# Patient Record
Sex: Male | Born: 1935 | Race: White | Hispanic: No | Marital: Married | Smoking: Former smoker
Health system: Southern US, Community
[De-identification: ages and names within clinical notes are randomized; demographics above are authoritative.]

## PROBLEM LIST (undated history)

## (undated) ENCOUNTER — Emergency Department (HOSPITAL_COMMUNITY): Discharge status: Absent without leave | Payer: Medicare HMO

## (undated) DIAGNOSIS — C4491 Basal cell carcinoma of skin, unspecified: Secondary | ICD-10-CM

## (undated) DIAGNOSIS — E785 Hyperlipidemia, unspecified: Secondary | ICD-10-CM

## (undated) DIAGNOSIS — K8020 Calculus of gallbladder without cholecystitis without obstruction: Secondary | ICD-10-CM

## (undated) DIAGNOSIS — R0602 Shortness of breath: Secondary | ICD-10-CM

## (undated) DIAGNOSIS — D500 Iron deficiency anemia secondary to blood loss (chronic): Secondary | ICD-10-CM

## (undated) DIAGNOSIS — C641 Malignant neoplasm of right kidney, except renal pelvis: Secondary | ICD-10-CM

## (undated) DIAGNOSIS — H9319 Tinnitus, unspecified ear: Secondary | ICD-10-CM

## (undated) DIAGNOSIS — I719 Aortic aneurysm of unspecified site, without rupture: Secondary | ICD-10-CM

## (undated) DIAGNOSIS — I10 Essential (primary) hypertension: Secondary | ICD-10-CM

## (undated) DIAGNOSIS — R001 Bradycardia, unspecified: Secondary | ICD-10-CM

## (undated) DIAGNOSIS — R3912 Poor urinary stream: Secondary | ICD-10-CM

## (undated) DIAGNOSIS — K219 Gastro-esophageal reflux disease without esophagitis: Secondary | ICD-10-CM

## (undated) DIAGNOSIS — M1990 Unspecified osteoarthritis, unspecified site: Secondary | ICD-10-CM

## (undated) HISTORY — PX: VASECTOMY: SHX75

## (undated) HISTORY — DX: Essential (primary) hypertension: I10

## (undated) HISTORY — PX: TONSILLECTOMY: SUR1361

## (undated) HISTORY — DX: Tinnitus, unspecified ear: H93.19

## (undated) HISTORY — DX: Basal cell carcinoma of skin, unspecified: C44.91

## (undated) HISTORY — DX: Hyperlipidemia, unspecified: E78.5

## (undated) HISTORY — DX: Unspecified osteoarthritis, unspecified site: M19.90

## (undated) HISTORY — DX: Iron deficiency anemia secondary to blood loss (chronic): D50.0

## (undated) HISTORY — DX: Malignant neoplasm of right kidney, except renal pelvis: C64.1

## (undated) HISTORY — PX: COLONOSCOPY: SHX174

---

## 1973-09-17 HISTORY — PX: ANKLE SURGERY: SHX546

## 2005-09-17 HISTORY — PX: TOTAL KNEE ARTHROPLASTY: SHX125

## 2012-10-13 DIAGNOSIS — Z135 Encounter for screening for eye and ear disorders: Secondary | ICD-10-CM | POA: Insufficient documentation

## 2012-10-13 DIAGNOSIS — M129 Arthropathy, unspecified: Secondary | ICD-10-CM | POA: Insufficient documentation

## 2012-10-13 DIAGNOSIS — C4490 Unspecified malignant neoplasm of skin, unspecified: Secondary | ICD-10-CM | POA: Insufficient documentation

## 2012-10-13 DIAGNOSIS — H9319 Tinnitus, unspecified ear: Secondary | ICD-10-CM | POA: Insufficient documentation

## 2013-03-10 ENCOUNTER — Ambulatory Visit (INDEPENDENT_AMBULATORY_CARE_PROVIDER_SITE_OTHER): Payer: Medicare Other | Admitting: Surgery

## 2013-03-10 ENCOUNTER — Encounter (INDEPENDENT_AMBULATORY_CARE_PROVIDER_SITE_OTHER): Payer: Self-pay | Admitting: Surgery

## 2013-03-10 VITALS — BP 120/58 | HR 64 | Temp 99.0°F | Resp 16 | Ht 72.0 in | Wt 182.0 lb

## 2013-03-10 DIAGNOSIS — K8010 Calculus of gallbladder with chronic cholecystitis without obstruction: Secondary | ICD-10-CM

## 2013-03-10 DIAGNOSIS — D126 Benign neoplasm of colon, unspecified: Secondary | ICD-10-CM | POA: Insufficient documentation

## 2013-03-10 DIAGNOSIS — K4020 Bilateral inguinal hernia, without obstruction or gangrene, not specified as recurrent: Secondary | ICD-10-CM | POA: Insufficient documentation

## 2013-03-10 NOTE — Progress Notes (Signed)
Patient ID: Bill Foley, male   DOB: [birth year], 79 y.o.   MRN: [id number]  Chief Complaint  Patient presents with  . New Evaluation    eval abd pain/gb/appendiceal adenoma/ ing henia    HPI Bill Foley is a 79 y.o. male.  Referred by Dr. Dulce Sellar for evaluation of adenoma at the appendiceal orifice and symptomatic gallstones.    PCP - Dr. Everlene Other HPI This is an 80 year old male in good health who presents in referral from Dr. Dulce Sellar. The patient had a recent physical examination and was found to have heme positive stool. He was asymptomatic with no significant abdominal pain, gross blood in stool, or change in bowel habits. He has had previous polyps before. He underwent a colonoscopy on 12/19/12. This showed multiple polyps including one at his transverse colon and one in the sigmoid colon. These were removed completely and showed tubular adenoma with no sign of high-grade dysplasia. He also was found to have a sessile polyp immediately adjacent to the appendiceal orifice measuring about 1 cm across. This tracks up into the appendix. Biopsy showed tubular adenoma negative for high grade dysplasia.  The patient reports that about 7 years ago he was living in [place]. He had a severe attack of right upper quadrant abdominal pain associated with nausea and vomiting. He underwent workup with an ultrasound and a HIDA scan.  Reportedly, he was told he had gallstones and he did have his gallbladder removed. However he wanted to have his knee replaced instead. Over the last 7 years he has had several episodes of postprandial right upper quadrant abdominal pain associated with nausea and vomiting. The pain resolves fairly quickly. He has had 2 episodes already this year. This tends to occur after eating greasy meals.  The patient also reports an enlarging bulge in his left groin that has been present for about 6 years. Previous to his knee surgery, he was a fairly active weight lifter. This has continued  to enlarge and has become uncomfortable. It remains reducible. He denies any right-sided symptoms.  The patient is remarkably healthy for his age and remains very active.   Past Medical History  Diagnosis Date  . Blood in stool   . Basal cell carcinoma   . Hyperlipidemia   . Tinnitus   . Hypertension   . Glaucoma   . Arthritis     Past Surgical History  Procedure Laterality Date  . Hernia repair    . Tonsillectomy    . Vasectomy    . Total knee arthroplasty    . Ankle surgery Left 1975  . Colon surgery  12/16/2012  Colonoscopy with polypectomy  History reviewed. No pertinent family history.  Social History History  Substance Use Topics  . Smoking status: Former Smoker    Quit date: 03/11/1963  . Smokeless tobacco: Not on file  . Alcohol Use: 0.0 oz/week    5-7 Glasses of wine per week    No Known Allergies  Current Outpatient Prescriptions  Medication Sig Dispense Refill  . aspirin 81 MG tablet Take 81 mg by mouth daily.      . chlorthalidone (HYGROTON) 25 MG tablet Take 25 mg by mouth daily.      . hydrALAZINE (APRESOLINE) 100 MG tablet Take 100 mg by mouth 2 (two) times daily.      Marland Kitchen ibuprofen (ADVIL,MOTRIN) 200 MG tablet Take 200 mg by mouth every 6 (six) hours as needed for pain.      Bill Foley  300 MG CAPS Take by mouth.      . losartan (COZAAR) 100 MG tablet Take 100 mg by mouth daily.      . pravastatin (PRAVACHOL) 40 MG tablet Take 40 mg by mouth daily.       No current facility-administered medications for this visit.    Review of Systems Review of Systems  Constitutional: Negative for fever, chills and unexpected weight change.  HENT: Negative for hearing loss, congestion, sore throat, trouble swallowing and voice change.   Eyes: Negative for visual disturbance.  Respiratory: Negative for cough and wheezing.   Cardiovascular: Negative for chest pain, palpitations and leg swelling.  Gastrointestinal: Positive for nausea, vomiting, abdominal pain  and blood in stool. Negative for diarrhea, constipation, abdominal distention, anal bleeding and rectal pain.  Genitourinary: Positive for scrotal swelling. Negative for hematuria and difficulty urinating.  Musculoskeletal: Negative for arthralgias.  Skin: Negative for rash and wound.  Neurological: Negative for seizures, syncope, weakness and headaches.  Hematological: Negative for adenopathy. Does not bruise/bleed easily.  Psychiatric/Behavioral: Negative for confusion.    Blood pressure 120/58, pulse 64, temperature 99 F (37.2 C), temperature source Temporal, resp. rate 16, height 6' (1.829 m), weight 182 lb (82.555 kg), peak flow 120 L/min.  Physical Exam Physical Exam WDWN in NAD HEENT:  EOMI, sclera anicteric Neck:  No masses, no thyromegaly Lungs:  CTA bilaterally; normal respiratory effort CV:  Regular rate and rhythm; no murmurs Abd:  +bowel sounds, soft, non-tender, no masses GU:  Bilateral descended testes; no testicular masses; large reducible left inguinal hernia; small reducible right inguinal hernia Ext:  Well-perfused; no edema Skin:  Warm, dry; no sign of jaundice  Data Reviewed Colonoscopy report from Dr. Dulce Sellar. Pathology report   Assessment    1.  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  Chronic calculus cholecystitis 3.  Bilateral inguinal hernias     Plan    Recommend laparoscopic partial cecectomy and laparoscopic cholecystectomy.  Would not recommend repairing the hernias at the same time due to the risk of bacterial seeding of the mesh with a clean-contaminated case.  Will consider repairing the hernias at a later date.  The surgical procedure has been discussed with the patient.  Potential risks, benefits, alternative treatments, and expected outcomes have been explained.  All of the patient's questions at this time have been answered.  The likelihood of reaching the patient's treatment goal is good.  The patient understand the proposed  surgical procedure and wishes to proceed.  I spent 40 minutes with the patient discussing all three of his surgical issues, the recommended surgical treatment plan, and the benefits and risks of the planned surgical procedure.            Joie Reamer K. 03/10/2013, 4:52 PM

## 2013-03-11 ENCOUNTER — Telehealth (INDEPENDENT_AMBULATORY_CARE_PROVIDER_SITE_OTHER): Payer: Self-pay | Admitting: General Surgery

## 2013-03-11 NOTE — Telephone Encounter (Signed)
Patient forgot to disclose information during visit 6/24.  Slight Aorta Aneurysm-Less than 2 cm Bill Foley-Cardiologist [facility]  Dr.Brockman-Vascular Surgeon [place] [phone]

## 2013-03-27 ENCOUNTER — Encounter (INDEPENDENT_AMBULATORY_CARE_PROVIDER_SITE_OTHER): Payer: Self-pay

## 2013-03-30 ENCOUNTER — Encounter (HOSPITAL_COMMUNITY): Payer: Self-pay | Admitting: Pharmacy Technician

## 2013-04-01 ENCOUNTER — Other Ambulatory Visit (HOSPITAL_COMMUNITY): Payer: Self-pay | Admitting: *Deleted

## 2013-04-02 ENCOUNTER — Encounter (HOSPITAL_COMMUNITY): Payer: Self-pay

## 2013-04-02 ENCOUNTER — Encounter (HOSPITAL_COMMUNITY)
Admission: RE | Admit: 2013-04-02 | Discharge: 2013-04-02 | Disposition: A | Discharge status: Home / Self Care | Payer: Medicare Other | Source: Ambulatory Visit | Attending: Surgery | Admitting: Surgery

## 2013-04-02 ENCOUNTER — Ambulatory Visit (HOSPITAL_COMMUNITY)
Admission: RE | Admit: 2013-04-02 | Discharge: 2013-04-02 | Disposition: A | Discharge status: Home / Self Care | Payer: Medicare Other | Source: Ambulatory Visit | Attending: Surgery | Admitting: Surgery

## 2013-04-02 DIAGNOSIS — K811 Chronic cholecystitis: Secondary | ICD-10-CM | POA: Insufficient documentation

## 2013-04-02 DIAGNOSIS — Z01810 Encounter for preprocedural cardiovascular examination: Secondary | ICD-10-CM | POA: Insufficient documentation

## 2013-04-02 DIAGNOSIS — R9431 Abnormal electrocardiogram [ECG] [EKG]: Secondary | ICD-10-CM | POA: Insufficient documentation

## 2013-04-02 DIAGNOSIS — D126 Benign neoplasm of colon, unspecified: Secondary | ICD-10-CM | POA: Insufficient documentation

## 2013-04-02 DIAGNOSIS — Z01812 Encounter for preprocedural laboratory examination: Secondary | ICD-10-CM | POA: Insufficient documentation

## 2013-04-02 HISTORY — DX: Poor urinary stream: R39.12

## 2013-04-02 HISTORY — DX: Gastro-esophageal reflux disease without esophagitis: K21.9

## 2013-04-02 HISTORY — DX: Aortic aneurysm of unspecified site, without rupture: I71.9

## 2013-04-02 HISTORY — DX: Bradycardia, unspecified: R00.1

## 2013-04-02 HISTORY — DX: Calculus of gallbladder without cholecystitis without obstruction: K80.20

## 2013-04-02 HISTORY — DX: Shortness of breath: R06.02

## 2013-04-02 LAB — BASIC METABOLIC PANEL
CO2: 26 mEq/L (ref 19–32)
Chloride: 104 mEq/L (ref 96–112)
Glucose, Bld: 93 mg/dL (ref 70–99)
Potassium: 4.4 mEq/L (ref 3.5–5.1)
Sodium: 140 mEq/L (ref 135–145)

## 2013-04-02 LAB — CBC
Hemoglobin: 16.1 g/dL (ref 13.0–17.0)
MCH: 30 pg (ref 26.0–34.0)
MCV: 88.6 fL (ref 78.0–100.0)
RBC: 5.36 MIL/uL (ref 4.22–5.81)

## 2013-04-02 NOTE — Progress Notes (Signed)
04/02/13 1006  OBSTRUCTIVE SLEEP APNEA  Have you ever been diagnosed with sleep apnea through a sleep study? No  Do you snore loudly (loud enough to be heard through closed doors)?  0  Do you often feel tired, fatigued, or sleepy during the daytime? 0  Has anyone observed you stop breathing during your sleep? 1  Do you have, or are you being treated for high blood pressure? 1  BMI more than 35 kg/m2? 0  Age over 77 years old? 1  Neck circumference greater than 40 cm/18 inches? 0  Gender: 1  Obstructive Sleep Apnea Score 4  Score 4 or greater  Results sent to PCP

## 2013-04-02 NOTE — Patient Instructions (Addendum)
Bill Foley  04/02/2013                           YOUR PROCEDURE IS SCHEDULED ON:  04/08/13               PLEASE REPORT TO SHORT STAY CENTER AT : 7:30 AM               CALL THIS NUMBER IF ANY PROBLEMS THE DAY OF SURGERY :               [phone]                      REMEMBER:   Do not eat food or drink liquids AFTER MIDNIGHT    Take these medicines the morning of surgery with A SIP OF WATER:  HYDRALAZINE   Do not wear jewelry, make-up   Do not wear lotions, powders, or perfumes.   Do not shave legs or underarms 12 hrs. before surgery (men may shave face)  Do not bring valuables to the hospital.  Contacts, dentures or bridgework may not be worn into surgery.  Leave suitcase in the car. After surgery it may be brought to your room.  For patients admitted to the hospital more than one night, checkout time is 11:00                          The day of discharge.   Patients discharged the day of surgery will not be allowed to drive home                             If going home same day of surgery, must have someone stay with you first                           24 hrs at home and arrange for some one to drive you home from hospital.    Special Instructions:   Please read over the following fact sheets that you were given:                                    1. [facility] PREPARING FOR SURGERY SHEET                                                X_____________________________________________________________________        Failure to follow these instructions may result in cancellation of your surgery

## 2013-04-08 ENCOUNTER — Encounter (HOSPITAL_COMMUNITY): Payer: Self-pay | Admitting: Anesthesiology

## 2013-04-08 ENCOUNTER — Encounter (HOSPITAL_COMMUNITY): Payer: Self-pay

## 2013-04-08 ENCOUNTER — Ambulatory Visit (HOSPITAL_COMMUNITY): Payer: Medicare Other

## 2013-04-08 ENCOUNTER — Encounter (HOSPITAL_COMMUNITY)
Admission: RE | Disposition: A | Discharge status: Home / Self Care | Payer: Self-pay | Source: Ambulatory Visit | Attending: Surgery

## 2013-04-08 ENCOUNTER — Observation Stay (HOSPITAL_COMMUNITY)
Admission: RE | Admit: 2013-04-08 | Discharge: 2013-04-09 | Disposition: A | Discharge status: Home / Self Care | Payer: Medicare Other | Source: Ambulatory Visit | Attending: Surgery | Admitting: Surgery

## 2013-04-08 ENCOUNTER — Ambulatory Visit (HOSPITAL_COMMUNITY): Payer: Medicare Other | Admitting: Anesthesiology

## 2013-04-08 DIAGNOSIS — R1011 Right upper quadrant pain: Secondary | ICD-10-CM | POA: Insufficient documentation

## 2013-04-08 DIAGNOSIS — E785 Hyperlipidemia, unspecified: Secondary | ICD-10-CM | POA: Insufficient documentation

## 2013-04-08 DIAGNOSIS — I10 Essential (primary) hypertension: Secondary | ICD-10-CM | POA: Insufficient documentation

## 2013-04-08 DIAGNOSIS — K4020 Bilateral inguinal hernia, without obstruction or gangrene, not specified as recurrent: Secondary | ICD-10-CM | POA: Insufficient documentation

## 2013-04-08 DIAGNOSIS — K8010 Calculus of gallbladder with chronic cholecystitis without obstruction: Secondary | ICD-10-CM

## 2013-04-08 DIAGNOSIS — R933 Abnormal findings on diagnostic imaging of other parts of digestive tract: Secondary | ICD-10-CM

## 2013-04-08 DIAGNOSIS — I739 Peripheral vascular disease, unspecified: Secondary | ICD-10-CM | POA: Insufficient documentation

## 2013-04-08 DIAGNOSIS — Z7982 Long term (current) use of aspirin: Secondary | ICD-10-CM | POA: Insufficient documentation

## 2013-04-08 DIAGNOSIS — Z79899 Other long term (current) drug therapy: Secondary | ICD-10-CM | POA: Insufficient documentation

## 2013-04-08 DIAGNOSIS — D126 Benign neoplasm of colon, unspecified: Secondary | ICD-10-CM | POA: Insufficient documentation

## 2013-04-08 DIAGNOSIS — K389 Disease of appendix, unspecified: Secondary | ICD-10-CM

## 2013-04-08 HISTORY — PX: CHOLECYSTECTOMY: SHX55

## 2013-04-08 HISTORY — PX: LAPAROSCOPIC APPENDECTOMY: SHX408

## 2013-04-08 LAB — CBC
MCV: 89.5 fL (ref 78.0–100.0)
Platelets: 142 10*3/uL — ABNORMAL LOW (ref 150–400)
RBC: 4.85 MIL/uL (ref 4.22–5.81)
RDW: 13 % (ref 11.5–15.5)
WBC: 12.5 10*3/uL — ABNORMAL HIGH (ref 4.0–10.5)

## 2013-04-08 LAB — CREATININE, SERUM
Creatinine, Ser: 1.16 mg/dL (ref 0.50–1.35)
GFR calc Af Amer: 67 mL/min — ABNORMAL LOW (ref >90)
GFR calc non Af Amer: 58 mL/min — ABNORMAL LOW (ref >90)

## 2013-04-08 SURGERY — LAPAROSCOPIC CHOLECYSTECTOMY WITH INTRAOPERATIVE CHOLANGIOGRAM
Anesthesia: General | Site: Abdomen | Wound class: Clean Contaminated

## 2013-04-08 MED ORDER — OXYCODONE-ACETAMINOPHEN 5-325 MG PO TABS: 1.0000 | ORAL_TABLET | ORAL | Status: DC | PRN

## 2013-04-08 MED ORDER — ONDANSETRON HCL 4 MG/2ML IJ SOLN
INTRAMUSCULAR | Status: DC | PRN
  Administered 2013-04-08 (×2): 2 mg via INTRAVENOUS

## 2013-04-08 MED ORDER — LACTATED RINGERS IR SOLN
Status: DC | PRN
  Administered 2013-04-08: 1000 mL

## 2013-04-08 MED ORDER — LACTATED RINGERS IV SOLN
INTRAVENOUS | Status: DC
  Administered 2013-04-08: 1000 mL via INTRAVENOUS

## 2013-04-08 MED ORDER — CEFAZOLIN SODIUM 1-5 GM-% IV SOLN
1.0000 g | Freq: Four times a day (QID) | INTRAVENOUS | Status: AC
  Administered 2013-04-08 – 2013-04-09 (×3): 1 g via INTRAVENOUS
  Filled 2013-04-08 (×3): qty 50

## 2013-04-08 MED ORDER — FENTANYL CITRATE 0.05 MG/ML IJ SOLN
25.0000 ug | INTRAMUSCULAR | Status: DC | PRN
  Administered 2013-04-08 (×3): 50 ug via INTRAVENOUS

## 2013-04-08 MED ORDER — IOHEXOL 300 MG/ML  SOLN
INTRAMUSCULAR | Status: DC | PRN
  Administered 2013-04-08: 5 mL via INTRAVENOUS

## 2013-04-08 MED ORDER — LACTATED RINGERS IV SOLN
INTRAVENOUS | Status: DC | PRN
  Administered 2013-04-08: 09:00:00 via INTRAVENOUS

## 2013-04-08 MED ORDER — SUCCINYLCHOLINE CHLORIDE 20 MG/ML IJ SOLN
INTRAMUSCULAR | Status: DC | PRN
  Administered 2013-04-08: 100 mg via INTRAVENOUS

## 2013-04-08 MED ORDER — SODIUM CHLORIDE 0.9 % IV SOLN: INTRAVENOUS | Status: DC

## 2013-04-08 MED ORDER — PROMETHAZINE HCL 25 MG/ML IJ SOLN: 6.2500 mg | INTRAMUSCULAR | Status: DC | PRN

## 2013-04-08 MED ORDER — IBUPROFEN 600 MG PO TABS
600.0000 mg | ORAL_TABLET | Freq: Four times a day (QID) | ORAL | Status: DC | PRN
  Filled 2013-04-08: qty 1

## 2013-04-08 MED ORDER — BUPIVACAINE-EPINEPHRINE 0.25% -1:200000 IJ SOLN
INTRAMUSCULAR | Status: DC | PRN
  Administered 2013-04-08: 23 mL

## 2013-04-08 MED ORDER — NEOSTIGMINE METHYLSULFATE 1 MG/ML IJ SOLN
INTRAMUSCULAR | Status: DC | PRN
  Administered 2013-04-08: 3 mg via INTRAVENOUS

## 2013-04-08 MED ORDER — MORPHINE SULFATE 2 MG/ML IJ SOLN
2.0000 mg | INTRAMUSCULAR | Status: DC | PRN
  Administered 2013-04-08: 2 mg via INTRAVENOUS
  Filled 2013-04-08: qty 1

## 2013-04-08 MED ORDER — GLYCOPYRROLATE 0.2 MG/ML IJ SOLN
INTRAMUSCULAR | Status: DC | PRN
  Administered 2013-04-08: 0.4 mg via INTRAVENOUS

## 2013-04-08 MED ORDER — SODIUM CHLORIDE 0.9 % IR SOLN
Status: DC | PRN
  Administered 2013-04-08: 5 mL

## 2013-04-08 MED ORDER — LOSARTAN POTASSIUM 50 MG PO TABS
100.0000 mg | ORAL_TABLET | Freq: Every day | ORAL | Status: DC
  Filled 2013-04-08: qty 2

## 2013-04-08 MED ORDER — KETOROLAC TROMETHAMINE 30 MG/ML IJ SOLN
15.0000 mg | Freq: Once | INTRAMUSCULAR | Status: AC | PRN
  Administered 2013-04-08: 30 mg via INTRAVENOUS

## 2013-04-08 MED ORDER — ATROPINE SULFATE 0.4 MG/ML IJ SOLN
INTRAMUSCULAR | Status: DC | PRN
  Administered 2013-04-08: 0.4 mg via INTRAVENOUS

## 2013-04-08 MED ORDER — SIMVASTATIN 20 MG PO TABS
20.0000 mg | ORAL_TABLET | Freq: Every day | ORAL | Status: DC
  Administered 2013-04-08: 20 mg via ORAL
  Filled 2013-04-08 (×2): qty 1

## 2013-04-08 MED ORDER — ONDANSETRON HCL 4 MG PO TABS: 4.0000 mg | ORAL_TABLET | Freq: Four times a day (QID) | ORAL | Status: DC | PRN

## 2013-04-08 MED ORDER — FENTANYL CITRATE 0.05 MG/ML IJ SOLN
INTRAMUSCULAR | Status: DC | PRN
  Administered 2013-04-08: 50 ug via INTRAVENOUS
  Administered 2013-04-08: 25 ug via INTRAVENOUS
  Administered 2013-04-08: 50 ug via INTRAVENOUS
  Administered 2013-04-08 (×5): 25 ug via INTRAVENOUS

## 2013-04-08 MED ORDER — LIDOCAINE HCL (CARDIAC) 20 MG/ML IV SOLN
INTRAVENOUS | Status: DC | PRN
  Administered 2013-04-08: 30 mg via INTRAVENOUS

## 2013-04-08 MED ORDER — CHLORHEXIDINE GLUCONATE 4 % EX LIQD
1.0000 "application " | Freq: Once | CUTANEOUS | Status: DC
  Filled 2013-04-08: qty 15

## 2013-04-08 MED ORDER — CISATRACURIUM BESYLATE (PF) 10 MG/5ML IV SOLN
INTRAVENOUS | Status: DC | PRN
  Administered 2013-04-08: 10 mg via INTRAVENOUS

## 2013-04-08 MED ORDER — CEFAZOLIN SODIUM-DEXTROSE 2-3 GM-% IV SOLR
2.0000 g | INTRAVENOUS | Status: AC
  Administered 2013-04-08: 2 g via INTRAVENOUS

## 2013-04-08 MED ORDER — HYDRALAZINE HCL 100 MG PO TABS: 100.0000 mg | ORAL_TABLET | Freq: Two times a day (BID) | ORAL | Status: DC

## 2013-04-08 MED ORDER — HYDRALAZINE HCL 50 MG PO TABS
100.0000 mg | ORAL_TABLET | Freq: Two times a day (BID) | ORAL | Status: DC
  Administered 2013-04-08 (×2): 100 mg via ORAL
  Filled 2013-04-08 (×5): qty 2

## 2013-04-08 MED ORDER — PANTOPRAZOLE SODIUM 40 MG IV SOLR
40.0000 mg | Freq: Once | INTRAVENOUS | Status: AC
  Administered 2013-04-08: 40 mg via INTRAVENOUS
  Filled 2013-04-08: qty 40

## 2013-04-08 MED ORDER — ONDANSETRON HCL 4 MG/2ML IJ SOLN: 4.0000 mg | Freq: Four times a day (QID) | INTRAMUSCULAR | Status: DC | PRN

## 2013-04-08 MED ORDER — CHLORTHALIDONE 25 MG PO TABS
12.5000 mg | ORAL_TABLET | Freq: Every evening | ORAL | Status: DC
  Administered 2013-04-08: 12.5 mg via ORAL
  Filled 2013-04-08 (×2): qty 0.5

## 2013-04-08 MED ORDER — ENOXAPARIN SODIUM 40 MG/0.4ML ~~LOC~~ SOLN
40.0000 mg | SUBCUTANEOUS | Status: DC
  Filled 2013-04-08 (×2): qty 0.4

## 2013-04-08 MED ORDER — LOSARTAN POTASSIUM 50 MG PO TABS: 100.0000 mg | ORAL_TABLET | Freq: Every morning | ORAL | Status: DC

## 2013-04-08 MED ORDER — PROPOFOL 10 MG/ML IV BOLUS
INTRAVENOUS | Status: DC | PRN
  Administered 2013-04-08: 150 mg via INTRAVENOUS

## 2013-04-08 SURGICAL SUPPLY — 51 items
APPLIER CLIP 5 13 M/L LIGAMAX5 (MISCELLANEOUS) ×3
APPLIER CLIP ROT 10 11.4 M/L (STAPLE) ×3
BENZOIN TINCTURE PRP APPL 2/3 (GAUZE/BANDAGES/DRESSINGS) ×3 IMPLANT
CANISTER SUCTION 2500CC (MISCELLANEOUS) ×3 IMPLANT
CHLORAPREP W/TINT 26ML (MISCELLANEOUS) ×3 IMPLANT
CLIP APPLIE 5 13 M/L LIGAMAX5 (MISCELLANEOUS) ×2 IMPLANT
CLIP APPLIE ROT 10 11.4 M/L (STAPLE) ×2 IMPLANT
CLOSURE STERI-STRIP 1/4X4 (GAUZE/BANDAGES/DRESSINGS) ×3 IMPLANT
CLOTH BEACON ORANGE TIMEOUT ST (SAFETY) ×3 IMPLANT
CORD HIGH FREQUENCY UNIPOLAR (ELECTROSURGICAL) ×3 IMPLANT
COVER MAYO STAND STRL (DRAPES) ×3 IMPLANT
CUTTER FLEX LINEAR 45M (STAPLE) ×3 IMPLANT
DECANTER SPIKE VIAL GLASS SM (MISCELLANEOUS) ×3 IMPLANT
DRAPE C-ARM 42X120 X-RAY (DRAPES) ×3 IMPLANT
DRAPE LAPAROSCOPIC ABDOMINAL (DRAPES) ×3 IMPLANT
DRAPE UTILITY XL STRL (DRAPES) ×3 IMPLANT
DRSG TEGADERM 2-3/8X2-3/4 SM (GAUZE/BANDAGES/DRESSINGS) ×12 IMPLANT
DRSG TEGADERM 4X4.75 (GAUZE/BANDAGES/DRESSINGS) ×3 IMPLANT
ELECT REM PT RETURN 9FT ADLT (ELECTROSURGICAL) ×3
ELECTRODE REM PT RTRN 9FT ADLT (ELECTROSURGICAL) ×2 IMPLANT
FILTER SMOKE EVAC LAPAROSHD (FILTER) ×3 IMPLANT
GLOVE BIO SURGEON STRL SZ7 (GLOVE) ×3 IMPLANT
GLOVE BIOGEL PI IND STRL 7.0 (GLOVE) ×2 IMPLANT
GLOVE BIOGEL PI IND STRL 7.5 (GLOVE) ×2 IMPLANT
GLOVE BIOGEL PI INDICATOR 7.0 (GLOVE) ×1
GLOVE BIOGEL PI INDICATOR 7.5 (GLOVE) ×1
GOWN STRL NON-REIN LRG LVL3 (GOWN DISPOSABLE) ×3 IMPLANT
GOWN STRL REIN XL XLG (GOWN DISPOSABLE) ×9 IMPLANT
HAND ACTIVATED (MISCELLANEOUS) ×3 IMPLANT
KIT BASIN OR (CUSTOM PROCEDURE TRAY) ×3 IMPLANT
NS IRRIG 1000ML POUR BTL (IV SOLUTION) ×3 IMPLANT
PENCIL BUTTON HOLSTER BLD 10FT (ELECTRODE) IMPLANT
POUCH SPECIMEN RETRIEVAL 10MM (ENDOMECHANICALS) ×3 IMPLANT
RELOAD 45 VASCULAR/THIN (ENDOMECHANICALS) IMPLANT
RELOAD STAPLE TA45 3.5 REG BLU (ENDOMECHANICALS) ×6 IMPLANT
RINGERS IRRIG 1000ML POUR BTL (IV SOLUTION) ×3 IMPLANT
SCISSORS LAP 5X35 DISP (ENDOMECHANICALS) ×3 IMPLANT
SET CHOLANGIOGRAPH MIX (MISCELLANEOUS) ×3 IMPLANT
SET IRRIG TUBING LAPAROSCOPIC (IRRIGATION / IRRIGATOR) ×3 IMPLANT
SOLUTION ANTI FOG 6CC (MISCELLANEOUS) ×3 IMPLANT
STRIP CLOSURE SKIN 1/2X4 (GAUZE/BANDAGES/DRESSINGS) ×3 IMPLANT
SUT MNCRL AB 4-0 PS2 18 (SUTURE) ×3 IMPLANT
SUT VICRYL 0 ENDOLOOP (SUTURE) IMPLANT
TOWEL OR 17X26 10 PK STRL BLUE (TOWEL DISPOSABLE) ×3 IMPLANT
TRAY FOLEY CATH 14FRSI W/METER (CATHETERS) IMPLANT
TRAY LAP CHOLE (CUSTOM PROCEDURE TRAY) ×3 IMPLANT
TROCAR BLADELESS OPT 5 75 (ENDOMECHANICALS) ×6 IMPLANT
TROCAR XCEL 12X100 BLDLESS (ENDOMECHANICALS) IMPLANT
TROCAR XCEL BLUNT TIP 100MML (ENDOMECHANICALS) ×3 IMPLANT
TROCAR XCEL NON-BLD 11X100MML (ENDOMECHANICALS) ×3 IMPLANT
TUBING INSUFFLATION 10FT LAP (TUBING) ×3 IMPLANT

## 2013-04-08 NOTE — H&P (View-Only) (Signed)
Patient ID: Bill Foley, male   DOB: 02-Nov-1930, 79 y.o.   MRN: [id number]  Chief Complaint  Patient presents with  . New Evaluation    eval abd pain/gb/appendiceal adenoma/ ing henia    HPI Bill Foley is a 79 y.o. male.  Referred by Dr. Dulce Sellar for evaluation of adenoma at the appendiceal orifice and symptomatic gallstones.    PCP - Dr. Everlene Other HPI This is an 79 year old male in good health who presents in referral from Dr. Dulce Sellar. The patient had a recent physical examination and was found to have heme positive stool. He was asymptomatic with no significant abdominal pain, gross blood in stool, or change in bowel habits. He has had previous polyps before. He underwent a colonoscopy on 12/19/12. This showed multiple polyps including one at his transverse colon and one in the sigmoid colon. These were removed completely and showed tubular adenoma with no sign of high-grade dysplasia. He also was found to have a sessile polyp immediately adjacent to the appendiceal orifice measuring about 1 cm across. This tracks up into the appendix. Biopsy showed tubular adenoma negative for high grade dysplasia.  The patient reports that about 7 years ago he was living in [place]. He had a severe attack of right upper quadrant abdominal pain associated with nausea and vomiting. He underwent workup with an ultrasound and a HIDA scan.  Reportedly, he was told he had gallstones and he did have his gallbladder removed. However he wanted to have his knee replaced instead. Over the last 7 years he has had several episodes of postprandial right upper quadrant abdominal pain associated with nausea and vomiting. The pain resolves fairly quickly. He has had 2 episodes already this year. This tends to occur after eating greasy meals.  The patient also reports an enlarging bulge in his left groin that has been present for about 6 years. Previous to his knee surgery, he was a fairly active weight lifter. This has continued  to enlarge and has become uncomfortable. It remains reducible. He denies any right-sided symptoms.  The patient is remarkably healthy for his age and remains very active.   Past Medical History  Diagnosis Date  . Blood in stool   . Basal cell carcinoma   . Hyperlipidemia   . Tinnitus   . Hypertension   . Glaucoma   . Arthritis     Past Surgical History  Procedure Laterality Date  . Hernia repair    . Tonsillectomy    . Vasectomy    . Total knee arthroplasty    . Ankle surgery Left 1975  . Colon surgery  12/16/2012  Colonoscopy with polypectomy  History reviewed. No pertinent family history.  Social History History  Substance Use Topics  . Smoking status: Former Smoker    Quit date: 03/11/1963  . Smokeless tobacco: Not on file  . Alcohol Use: 0.0 oz/week    5-7 Glasses of wine per week    No Known Allergies  Current Outpatient Prescriptions  Medication Sig Dispense Refill  . aspirin 81 MG tablet Take 81 mg by mouth daily.      . chlorthalidone (HYGROTON) 25 MG tablet Take 25 mg by mouth daily.      . hydrALAZINE (APRESOLINE) 100 MG tablet Take 100 mg by mouth 2 (two) times daily.      Marland Kitchen ibuprofen (ADVIL,MOTRIN) 200 MG tablet Take 200 mg by mouth every 6 (six) hours as needed for pain.      [name]  300 MG CAPS Take by mouth.      . losartan (COZAAR) 100 MG tablet Take 100 mg by mouth daily.      . pravastatin (PRAVACHOL) 40 MG tablet Take 40 mg by mouth daily.       No current facility-administered medications for this visit.    Review of Systems Review of Systems  Constitutional: Negative for fever, chills and unexpected weight change.  HENT: Negative for hearing loss, congestion, sore throat, trouble swallowing and voice change.   Eyes: Negative for visual disturbance.  Respiratory: Negative for cough and wheezing.   Cardiovascular: Negative for chest pain, palpitations and leg swelling.  Gastrointestinal: Positive for nausea, vomiting, abdominal pain  and blood in stool. Negative for diarrhea, constipation, abdominal distention, anal bleeding and rectal pain.  Genitourinary: Positive for scrotal swelling. Negative for hematuria and difficulty urinating.  Musculoskeletal: Negative for arthralgias.  Skin: Negative for rash and wound.  Neurological: Negative for seizures, syncope, weakness and headaches.  Hematological: Negative for adenopathy. Does not bruise/bleed easily.  Psychiatric/Behavioral: Negative for confusion.    Blood pressure 120/58, pulse 64, temperature 99 F (37.2 C), temperature source Temporal, resp. rate 16, height 6' (1.829 m), weight 182 lb (82.555 kg), peak flow 120 L/min.  Physical Exam Physical Exam WDWN in NAD HEENT:  EOMI, sclera anicteric Neck:  No masses, no thyromegaly Lungs:  CTA bilaterally; normal respiratory effort CV:  Regular rate and rhythm; no murmurs Abd:  +bowel sounds, soft, non-tender, no masses GU:  Bilateral descended testes; no testicular masses; large reducible left inguinal hernia; small reducible right inguinal hernia Ext:  Well-perfused; no edema Skin:  Warm, dry; no sign of jaundice  Data Reviewed Colonoscopy report from Dr. Dulce Sellar. Pathology report   Assessment    1.  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  Chronic calculus cholecystitis 3.  Bilateral inguinal hernias     Plan    Recommend laparoscopic partial cecectomy and laparoscopic cholecystectomy.  Would not recommend repairing the hernias at the same time due to the risk of bacterial seeding of the mesh with a clean-contaminated case.  Will consider repairing the hernias at a later date.  The surgical procedure has been discussed with the patient.  Potential risks, benefits, alternative treatments, and expected outcomes have been explained.  All of the patient's questions at this time have been answered.  The likelihood of reaching the patient's treatment goal is good.  The patient understand the proposed  surgical procedure and wishes to proceed.  I spent 40 minutes with the patient discussing all three of his surgical issues, the recommended surgical treatment plan, and the benefits and risks of the planned surgical procedure.            Riva Sesma K. 03/10/2013, 4:52 PM

## 2013-04-08 NOTE — Transfer of Care (Signed)
Immediate Anesthesia Transfer of Care Note  Patient: Bill Foley  Procedure(s) Performed: Procedure(s): LAPAROSCOPIC CHOLECYSTECTOMY WITH INTRAOPERATIVE CHOLANGIOGRAM (N/A) LAPAROSCOPIC PARTIAL CECECTOMY (N/A)  Patient Location: PACU  Anesthesia Type:General  Level of Consciousness: awake, alert  and patient cooperative  Airway & Oxygen Therapy: Patient Spontanous Breathing and Patient connected to face mask oxygen  Post-op Assessment: Report given to PACU RN, Post -op Vital signs reviewed and stable and Patient moving all extremities X 4  Post vital signs: Reviewed and stable  Complications: No apparent anesthesia complications

## 2013-04-08 NOTE — Op Note (Signed)
Laparoscopic Cholecystectomy with IOC/ laparoscopic appendectomy Procedure Note  Indications: This patient presents with symptomatic gallbladder disease and will undergo laparoscopic cholecystectomy.  He also has a polyp at the appendiceal orifice that is not resectable by colonoscopy.  Pre-operative Diagnosis: Calculus of gallbladder with other cholecystitis, without mention of obstruction    Cecal polyp at appendiceal orifice  Post-operative Diagnosis: Same  Surgeon: Wynona Luna.   Assistants: Axel Filler, MD  Anesthesia: General endotracheal anesthesia  ASA Class: 2  Procedure Details  The patient was seen again in the Holding Room. The risks, benefits, complications, treatment options, and expected outcomes were discussed with the patient. The possibilities of reaction to medication, pulmonary aspiration, perforation of viscus, bleeding, recurrent infection, finding a normal gallbladder, the need for additional procedures, failure to diagnose a condition, the possible need to convert to an open procedure, and creating a complication requiring transfusion or operation were discussed with the patient. The likelihood of improving the patient's symptoms with return to their baseline status is good.  The patient and/or family concurred with the proposed plan, giving informed consent. The site of surgery properly noted. The patient was taken to Operating Room, identified as Ronne Binning and the procedure verified as Laparoscopic Cholecystectomy with Intraoperative Cholangiogram. A Time Out was held and the above information confirmed.  Prior to the induction of general anesthesia, antibiotic prophylaxis was administered. General endotracheal anesthesia was then administered and tolerated well. After the induction, the abdomen was prepped with Chloraprep and draped in the sterile fashion. The patient was positioned in the supine position.  Local anesthetic agent was injected into the  skin near the umbilicus and an incision made. We dissected down to the abdominal fascia with blunt dissection.  The fascia was incised vertically and we entered the peritoneal cavity bluntly.  A pursestring suture of 0-Vicryl was placed around the fascial opening.  The Hasson cannula was inserted and secured with the stay suture.  Pneumoperitoneum was then created with CO2 and tolerated well without any adverse changes in the patient's vital signs. An 11-mm port was placed in the subxiphoid position.  Two 5-mm ports were placed in the right upper quadrant. All skin incisions were infiltrated with a local anesthetic agent before making the incision and placing the trocars.   We positioned the patient in reverse Trendelenburg, tilted slightly to the patient's left.  The gallbladder was identified, the fundus grasped and retracted cephalad. Adhesions were lysed bluntly and with the electrocautery where indicated, taking care not to injure any adjacent organs or viscus. The infundibulum was grasped and retracted laterally, exposing the peritoneum overlying the triangle of Calot. This was then divided and exposed in a blunt fashion. A critical view of the cystic duct and cystic artery was obtained.  The cystic duct was clearly identified and bluntly dissected circumferentially. The cystic duct was ligated with a clip distally.   An incision was made in the cystic duct and the [facility] cholangiogram catheter introduced. The catheter was secured using a clip. A cholangiogram was then obtained which showed good visualization of the distal and proximal biliary tree with no sign of filling defects or obstruction.  Contrast flowed easily into the duodenum. The catheter was then removed.   The cystic duct was then ligated with clips and divided. The cystic artery was identified, dissected free, ligated with clips and divided as well.   The gallbladder was dissected from the liver bed in retrograde fashion with the  electrocautery. The gallbladder was removed and  placed in an Endocatch sac. The liver bed was irrigated and inspected. Hemostasis was achieved with the electrocautery.The gallbladder and Endocatch sac were then removed through the umbilical port site. We had to enlarge the fascial opening slightly to allow removal of the gallbladder and the large stones within the gallbladder.   The patient was placed in Trendelenburg and left lateral decubitus position.  The scope was moved to the epigastric site. The cecum was mobilized medially.  The appendix was identified and was grasped with a clamp.  The appendix was carefully dissected. The appendix was was skeletonized with the harmonic scalpel.  We used two loads of the endoGIA stapler to resect the corner of the cecum with the appendix.  There was no evidence of bleeding, leakage, or complication after division of the appendix. Irrigation was also performed and irrigate suctioned from the abdomen as well.  The umbilical port site was closed with the purse string suture. There was no residual palpable fascial defect.  The trocar site skin wounds were closed with 4-0 Monocryl.  Instrument, sponge, and needle counts were correct at the conclusion of the case.    We again inspected the right upper quadrant for hemostasis.  Pneumoperitoneum was released as we removed the trocars.  4-0 Monocryl was used to close the skin.   Benzoin, steri-strips, and clean dressings were applied. The patient was then extubated and brought to the recovery room in stable condition. Instrument, sponge, and needle counts were correct at closure and at the conclusion of the case.   Findings: Cholecystitis with Cholelithiasis Normal-appearing appendix  Estimated Blood Loss: Minimal         Drains: none         Specimens: Gallbladder     Appendix with portion of cecum         Complications: None; patient tolerated the procedure well.         Disposition: PACU - hemodynamically  stable.         Condition: stable  Wilmon Arms. Corliss Skains, MD, [facility] Surgery  General/ Trauma Surgery  04/08/2013 11:31 AM

## 2013-04-08 NOTE — Anesthesia Preprocedure Evaluation (Signed)
Anesthesia Evaluation  Patient identified by MRN, date of birth, ID band Patient awake    Reviewed: Allergy & Precautions, H&P , NPO status , Patient's Chart, lab work & pertinent test results  Airway Mallampati: II TM Distance: >3 FB Neck ROM: Full    Dental no notable dental hx.    Pulmonary neg pulmonary ROS,  breath sounds clear to auscultation  Pulmonary exam normal       Cardiovascular hypertension, Pt. on medications + Peripheral Vascular Disease Rhythm:Regular Rate:Normal     Neuro/Psych negative neurological ROS  negative psych ROS   GI/Hepatic negative GI ROS, Neg liver ROS,   Endo/Other  negative endocrine ROS  Renal/GU negative Renal ROS  negative genitourinary   Musculoskeletal negative musculoskeletal ROS (+)   Abdominal   Peds negative pediatric ROS (+)  Hematology negative hematology ROS (+)   Anesthesia Other Findings   Reproductive/Obstetrics negative OB ROS                           Anesthesia Physical Anesthesia Plan  ASA: III  Anesthesia Plan: General   Post-op Pain Management:    Induction: Intravenous  Airway Management Planned: Oral ETT  Additional Equipment:   Intra-op Plan:   Post-operative Plan: Extubation in OR  Informed Consent: I have reviewed the patients History and Physical, chart, labs and discussed the procedure including the risks, benefits and alternatives for the proposed anesthesia with the patient or authorized representative who has indicated his/her understanding and acceptance.   Dental advisory given  Plan Discussed with: CRNA and Surgeon  Anesthesia Plan Comments:         Anesthesia Quick Evaluation

## 2013-04-08 NOTE — Interval H&P Note (Signed)
History and Physical Interval Note:  04/08/2013 7:47 AM  Bill Foley  has presented today for surgery, with the diagnosis of chronic cholecystitis; cecal polyp  The various methods of treatment have been discussed with the patient and family. After consideration of risks, benefits and other options for treatment, the patient has consented to  Procedure(s): LAPAROSCOPIC CHOLECYSTECTOMY WITH INTRAOPERATIVE CHOLANGIOGRAM (N/A) LAPAROSCOPIC PARTIAL CECECTOMY (N/A) as a surgical intervention .  The patient's history has been reviewed, patient examined, no change in status, stable for surgery.  I have reviewed the patient's chart and labs.  Questions were answered to the patient's satisfaction.     [name] K.

## 2013-04-08 NOTE — Anesthesia Postprocedure Evaluation (Signed)
  Anesthesia Post-op Note  Patient: Bill Foley  Procedure(s) Performed: Procedure(s) (LRB): LAPAROSCOPIC CHOLECYSTECTOMY WITH INTRAOPERATIVE CHOLANGIOGRAM (N/A) LAPAROSCOPIC PARTIAL CECECTOMY (N/A)  Patient Location: PACU  Anesthesia Type: General  Level of Consciousness: awake and alert   Airway and Oxygen Therapy: Patient Spontanous Breathing  Post-op Pain: mild  Post-op Assessment: Post-op Vital signs reviewed, Patient's Cardiovascular Status Stable, Respiratory Function Stable, Patent Airway and No signs of Nausea or vomiting  Last Vitals:  Filed Vitals:   04/08/13 1115  BP: 176/67  Pulse: 56  Temp: 36.4 C  Resp: 13    Post-op Vital Signs: stable   Complications: No apparent anesthesia complications

## 2013-04-09 ENCOUNTER — Encounter (HOSPITAL_COMMUNITY): Payer: Self-pay | Admitting: Surgery

## 2013-04-09 LAB — GLUCOSE, CAPILLARY: Glucose-Capillary: 100 mg/dL — ABNORMAL HIGH (ref 70–99)

## 2013-04-09 MED ORDER — HYDROCODONE-ACETAMINOPHEN 5-325 MG PO TABS: 1.0000 | ORAL_TABLET | ORAL | Status: DC | PRN

## 2013-04-09 NOTE — Discharge Summary (Signed)
Physician Discharge Summary  Patient ID: Bill Foley MRN: [id number] DOB/AGE: 1931-09-07 79 y.o.  Admit date: 04/08/2013 Discharge date: 04/09/2013  Admission Diagnoses:  Adenomatous polyp - cecum                         Chronic calculus cholecystitis  Discharge Diagnoses: Same Active Problems:   * No active hospital problems. *   Discharged Condition: good  Hospital Course: Lap chole with IOC/ laparoscopic appendectomy including small portion of cecum on 7/23.  Kept overnight for observation.  Did well.  Tolerating diet.  Ready for discharge  Consults: None  Significant Diagnostic Studies: none  Treatments: surgery: as above  Discharge Exam: Blood pressure 149/64, pulse 52, temperature 98.2 F (36.8 C), temperature source Oral, resp. rate 18, height 6' (1.829 m), weight 182 lb 4 oz (82.668 kg), SpO2 97.00%. General appearance: alert, cooperative and no distress GI: soft, minimal tenderness incisions c/d/i  Disposition: Final discharge disposition not confirmed  Discharge Orders   Future Appointments Provider Department Dept Phone   04/30/2013 9:40 AM Wilmon Arms. Zackari Ruane, MD [facility], [place] [phone]   Future Orders Complete By Expires     Call MD for:  persistant nausea and vomiting  As directed     Call MD for:  redness, tenderness, or signs of infection (pain, swelling, redness, odor or green/yellow discharge around incision site)  As directed     Call MD for:  severe uncontrolled pain  As directed     Call MD for:  temperature >100.4  As directed     Diet general  As directed     Driving Restrictions  As directed     Comments:      Do not drive while taking pain medications    Increase activity slowly  As directed     May shower / Bathe  As directed     May walk up steps  As directed         Medication List         aspirin 81 MG tablet  Take 81 mg by mouth daily.     chlorthalidone 25 MG tablet  Commonly known as:  HYGROTON  Take 12.5 mg  by mouth every evening.     hydrALAZINE 100 MG tablet  Commonly known as:  APRESOLINE  Take 100 mg by mouth 2 (two) times daily.     HYDROcodone-acetaminophen 5-325 MG per tablet  Commonly known as:  NORCO/VICODIN  Take 1 tablet by mouth every 4 (four) hours as needed for pain.     ibuprofen 200 MG tablet  Commonly known as:  ADVIL,MOTRIN  Take 200 mg by mouth every 6 (six) hours as needed for pain.     Krill Oil 300 MG Caps  Take 300 mg by mouth every Monday, Wednesday, and Friday.     losartan 100 MG tablet  Commonly known as:  COZAAR  Take 100 mg by mouth every morning.     pravastatin 40 MG tablet  Commonly known as:  PRAVACHOL  Take 40 mg by mouth every evening.           Follow-up Information   Follow up with Wylder Macomber K., MD. Schedule an appointment as soon as possible for a visit in 3 weeks.   Contact information:   [address] [place] [address] [phone]       Signed: Wynona Luna. 04/09/2013, 9:11 AM

## 2013-04-09 NOTE — Progress Notes (Signed)
Patient given all discharge instructions, and verbalized an understanding of all information.  Patient to travel home with family.  Philomena Doheny RN

## 2013-04-30 ENCOUNTER — Ambulatory Visit (INDEPENDENT_AMBULATORY_CARE_PROVIDER_SITE_OTHER): Payer: Medicare Other | Admitting: Surgery

## 2013-04-30 ENCOUNTER — Encounter (INDEPENDENT_AMBULATORY_CARE_PROVIDER_SITE_OTHER): Payer: Self-pay | Admitting: Surgery

## 2013-04-30 VITALS — BP 132/60 | HR 56 | Resp 16 | Ht 72.0 in | Wt 182.2 lb

## 2013-04-30 DIAGNOSIS — K8010 Calculus of gallbladder with chronic cholecystitis without obstruction: Secondary | ICD-10-CM

## 2013-04-30 DIAGNOSIS — K4020 Bilateral inguinal hernia, without obstruction or gangrene, not specified as recurrent: Secondary | ICD-10-CM

## 2013-04-30 DIAGNOSIS — D126 Benign neoplasm of colon, unspecified: Secondary | ICD-10-CM

## 2013-04-30 NOTE — Progress Notes (Signed)
Patient ID: Bill Foley, male   DOB: Sep 06, 1931, 79 y.o.   MRN: [id number]  Chief Complaint  Patient presents with  . Routine Post Op    po GB    HPI Bill Foley is a 79 y.o. male.   HPI The patient recently underwent laparoscopic cholecystectomy and laparoscopic partial cecectomy. Pathology showed chronic calculus cholecystitisAs well as a benign adenoma at the appendiceal orifice. The patient is doing quite well from that standpoint. He is having some diarrhea especially after eating. I encouraged him to use a fiber supplement to help control his bowel movements.  The patient has resumed full activity.  Prior to surgery he was noted to have a large left inguinal hernia and a small reducible right inguinal hernia. I recommended a delayed repair of the inguinal hernias as he was having a clean contaminated case and there was increase risk of mesh infection. The patient is now interested in scheduling his hernia repairs. Past Medical History  Diagnosis Date  . Basal cell carcinoma   . Hyperlipidemia   . Tinnitus   . Hypertension   . Aortic aneurysm   . Aortic aneurysm     "JUST UNDER 2 CM"  . Shortness of breath     WITH EXERTION  . GERD (gastroesophageal reflux disease)     OCCASIONAL  . Gallstones   . Weak urinary stream   . Arthritis     RT WRIST  . Slow heart rate     "has been down to 45 in the past"    Past Surgical History  Procedure Laterality Date  . Tonsillectomy    . Vasectomy    . Total knee arthroplasty  2007    RT  . Ankle surgery Left 1975  . Cholecystectomy N/A 04/08/2013    Procedure: LAPAROSCOPIC CHOLECYSTECTOMY WITH INTRAOPERATIVE CHOLANGIOGRAM;  Surgeon: Wilmon Arms. Corliss Skains, MD;  Location: [facility];  Service: General;  Laterality: N/A;  . Laparoscopic appendectomy N/A 04/08/2013    Procedure: LAPAROSCOPIC PARTIAL CECECTOMY;  Surgeon: Wilmon Arms. Corliss Skains, MD;  Location: [facility];  Service: General;  Laterality: N/A;    History reviewed. No pertinent family  history.  Social History History  Substance Use Topics  . Smoking status: Former Smoker    Quit date: 03/11/1963  . Smokeless tobacco: Not on file  . Alcohol Use: 0.0 oz/week    5-7 Glasses of wine per week    No Known Allergies  Current Outpatient Prescriptions  Medication Sig Dispense Refill  . aspirin 81 MG tablet Take 81 mg by mouth daily.      . chlorthalidone (HYGROTON) 25 MG tablet Take 12.5 mg by mouth every evening.       . hydrALAZINE (APRESOLINE) 100 MG tablet Take 100 mg by mouth 2 (two) times daily.      Marland Kitchen ibuprofen (ADVIL,MOTRIN) 200 MG tablet Take 200 mg by mouth every 6 (six) hours as needed for pain.      Boris Lown Oil 300 MG CAPS Take 300 mg by mouth every Monday, Wednesday, and Friday.       . losartan (COZAAR) 100 MG tablet Take 100 mg by mouth every morning.       . pravastatin (PRAVACHOL) 40 MG tablet Take 40 mg by mouth every evening.       Marland Kitchen HYDROcodone-acetaminophen (NORCO/VICODIN) 5-325 MG per tablet Take 1 tablet by mouth every 4 (four) hours as needed for pain.  40 tablet  0   No current facility-administered medications for  this visit.    Review of Systems Review of Systems  Constitutional: Negative for fever, chills and unexpected weight change.  HENT: Negative for hearing loss, congestion, sore throat, trouble swallowing and voice change.   Eyes: Negative for visual disturbance.  Respiratory: Negative for cough and wheezing.   Cardiovascular: Negative for chest pain, palpitations and leg swelling.  Gastrointestinal: Negative for nausea, vomiting, abdominal pain, diarrhea, constipation, blood in stool, abdominal distention, anal bleeding and rectal pain.  Genitourinary: Positive for scrotal swelling. Negative for hematuria and difficulty urinating.  Musculoskeletal: Negative for arthralgias.  Skin: Negative for rash and wound.  Neurological: Negative for seizures, syncope, weakness and headaches.  Hematological: Negative for adenopathy. Does not  bruise/bleed easily.  Psychiatric/Behavioral: Negative for confusion.    Blood pressure 132/60, pulse 56, resp. rate 16, height 6' (1.829 m), weight 182 lb 3.2 oz (82.645 kg).  Physical Exam Physical Exam WDWN in NAD HEENT:  EOMI, sclera anicteric Neck:  No masses, no thyromegaly Lungs:  CTA bilaterally; normal respiratory effort CV:  Regular rate and rhythm; no murmurs Abd:  +bowel sounds, soft, non-tender, healed laparoscopic incisions; no tenderness; GU;  Bilateral descended testes; no testicular masses; large reducible left inguinal hernia; small reducible right inguinal hernia  Ext:  Well-perfused; no edema Skin:  Warm, dry; no sign of jaundice  Data Reviewed none  Assessment    Recovered from lap chole and lap cecectomy.  No further treatment needed for benign adenoma at appendiceal orifice.  Bilateral inguinal hernias - large left, smaller right     Plan    Open bilateral inguinal hernia repairs with mesh.  The surgical procedure has been discussed with the patient.  Potential risks, benefits, alternative treatments, and expected outcomes have been explained.  All of the patient's questions at this time have been answered.  The likelihood of reaching the patient's treatment goal is good.  The patient understand the proposed surgical procedure and wishes to proceed. The patient will call back to schedule later in the year.        Bill Meda K. 04/30/2013, 11:57 AM

## 2013-04-30 NOTE — Patient Instructions (Addendum)
Call our schedulers at [phone] about a month before you want to have your hernia surgery.

## 2013-05-05 ENCOUNTER — Encounter: Payer: Self-pay | Admitting: Cardiovascular Disease

## 2013-05-08 ENCOUNTER — Encounter: Payer: Self-pay | Admitting: Cardiovascular Disease

## 2013-05-08 ENCOUNTER — Ambulatory Visit (INDEPENDENT_AMBULATORY_CARE_PROVIDER_SITE_OTHER): Payer: Medicare Other | Admitting: Cardiovascular Disease

## 2013-05-08 VITALS — BP 140/78 | HR 56 | Ht 72.0 in | Wt 183.3 lb

## 2013-05-08 DIAGNOSIS — I719 Aortic aneurysm of unspecified site, without rupture: Secondary | ICD-10-CM

## 2013-05-08 DIAGNOSIS — R0600 Dyspnea, unspecified: Secondary | ICD-10-CM

## 2013-05-08 DIAGNOSIS — E785 Hyperlipidemia, unspecified: Secondary | ICD-10-CM

## 2013-05-08 DIAGNOSIS — R0609 Other forms of dyspnea: Secondary | ICD-10-CM

## 2013-05-08 DIAGNOSIS — I10 Essential (primary) hypertension: Secondary | ICD-10-CM

## 2013-05-08 DIAGNOSIS — R0602 Shortness of breath: Secondary | ICD-10-CM

## 2013-05-08 NOTE — Patient Instructions (Addendum)
Your doctor wants you to have a MET Test which our office will schedule on Monday and call with the appointment.   Your physician recommends that you schedule a follow-up appointment in: One month.  We will send for your records from [place], [place]

## 2013-05-12 ENCOUNTER — Encounter: Payer: Self-pay | Admitting: Cardiovascular Disease

## 2013-05-12 DIAGNOSIS — R0600 Dyspnea, unspecified: Secondary | ICD-10-CM | POA: Insufficient documentation

## 2013-05-12 DIAGNOSIS — I719 Aortic aneurysm of unspecified site, without rupture: Secondary | ICD-10-CM | POA: Insufficient documentation

## 2013-05-12 DIAGNOSIS — E785 Hyperlipidemia, unspecified: Secondary | ICD-10-CM | POA: Insufficient documentation

## 2013-05-12 DIAGNOSIS — I10 Essential (primary) hypertension: Secondary | ICD-10-CM | POA: Insufficient documentation

## 2013-05-12 NOTE — Assessment & Plan Note (Signed)
His symptoms of dyspnea would best evaluated with a cardiopulmonary stress test. This will allow for simultaneous evaluation of pulmonary and cardiac function and the best path towards further diagnosis. An echo will also be helpful.

## 2013-05-12 NOTE — Assessment & Plan Note (Signed)
Need to get his latest lipid profile report

## 2013-05-12 NOTE — Assessment & Plan Note (Signed)
We'll need to get the records from [place] city. I doubt that the real dimension of the aneurysm was 2 cm. I suspect that the area of dilatation might have been the aortic root since it sounds like the initial study was in echocardiogram. We'll check an echo since this will also allow for evaluation of his murmur. Further imaging studies will depend on the information we received from his previous physicians.

## 2013-05-12 NOTE — Assessment & Plan Note (Signed)
Fair control. If he indeed has aneurysm of the aorta beta blockers may be a consideration, but his resting bradycardia may prevent their use. Losartan may be beneficial in preventing aneurysm progression and should be continued.

## 2013-05-12 NOTE — Progress Notes (Signed)
Patient ID: JOYCE LECKEY, male   DOB: [birth year], 79 y.o.   MRN: [id number]     Reason for office visit Aortic aneurysm, this  Mr. Lowenthal is an 79 year old gentleman that is extremely fit and looks considerably younger than his stated age. He engages in high impact aerobics and has been athletic his whole life. He complains today of shortness of breath. He is still very active performing exertion that is quite intense but feels distinctly less tolerant to exercise then 6 months ago. He denies chest pain, palpitations, syncope, edema, focal neurological deficits or intermittent claudication.  Until recently he lived in [place]. There, he was evaluated by Dr. Felipa Furnace and Dr. Emily Filbert (cardiology). At some point she underwent a study showed the presence of an aortic aneurysm. As far as I can tell what happened is that a murmur was auscultated. This led to a study which I presume was an echocardiogram and a diagnosis of an aortic aneurysm that was confirmed by CT. Mr. Caltagirone does not know which part of his aorta was involved with the aneurysm. He feels confident when he tells me that the aneurysm was only "2 cm in size". I told him this would be a normal dilation for any part of the aorta, but he remains confident that he heard 2 cm .  He has moved to [facility] and is now seeing Dr. Everlene Other. He has a long-standing history of hypertension hypercholesterolemia both of them treated with pharmacological agents. There is no family history of sudden cardiac death or ruptured aneurysm or aortic dissection.    No Known Allergies  Current Outpatient Prescriptions  Medication Sig Dispense Refill  . aspirin 81 MG tablet Take 81 mg by mouth daily.      . hydrALAZINE (APRESOLINE) 100 MG tablet Take 100 mg by mouth 2 (two) times daily.      Marland Kitchen ibuprofen (ADVIL,MOTRIN) 200 MG tablet Take 200 mg by mouth every 6 (six) hours as needed for pain.      Boris Lown Oil 300 MG CAPS Take 300 mg by mouth every Monday,  Wednesday, and Friday.       . losartan (COZAAR) 100 MG tablet Take 100 mg by mouth every morning.       Marland Kitchen OVER THE COUNTER MEDICATION Omega Red Takes M/W/F      . pravastatin (PRAVACHOL) 40 MG tablet Take 40 mg by mouth every evening.       . chlorthalidone (HYGROTON) 25 MG tablet Take 12.5 mg by mouth every evening.       Marland Kitchen HYDROcodone-acetaminophen (NORCO/VICODIN) 5-325 MG per tablet Take 1 tablet by mouth every 4 (four) hours as needed for pain.  40 tablet  0   No current facility-administered medications for this visit.    Past Medical History  Diagnosis Date  . Basal cell carcinoma   . Hyperlipidemia   . Tinnitus   . Hypertension   . Aortic aneurysm   . Aortic aneurysm     "JUST UNDER 2 CM"  . Shortness of breath     WITH EXERTION  . GERD (gastroesophageal reflux disease)     OCCASIONAL  . Gallstones   . Weak urinary stream   . Arthritis     RT WRIST  . Slow heart rate     "has been down to 45 in the past"    Past Surgical History  Procedure Laterality Date  . Tonsillectomy    . Vasectomy    . Total knee  arthroplasty  2007    RT  . Ankle surgery Left 1975  . Cholecystectomy N/A 04/08/2013    Procedure: LAPAROSCOPIC CHOLECYSTECTOMY WITH INTRAOPERATIVE CHOLANGIOGRAM;  Surgeon: Wilmon Arms. Corliss Skains, MD;  Location: [facility];  Service: General;  Laterality: N/A;  . Laparoscopic appendectomy N/A 04/08/2013    Procedure: LAPAROSCOPIC PARTIAL CECECTOMY;  Surgeon: Wilmon Arms. Corliss Skains, MD;  Location: [facility];  Service: General;  Laterality: N/A;    No family history on file.  History   Social History  . Marital Status: Married    Spouse Name: N/A    Number of Children: N/A  . Years of Education: N/A   Occupational History  . Not on file.   Social History Main Topics  . Smoking status: Former Smoker    Quit date: 03/11/1963  . Smokeless tobacco: Not on file  . Alcohol Use: 0.0 oz/week    5-7 Glasses of wine per week  . Drug Use: No  . Sexual Activity: Not on file    Other Topics Concern  . Not on file   Social History Narrative  . No narrative on file    Review of systems: The patient specifically denies any chest pain at rest or with exertion, dyspnea at rest, orthopnea, paroxysmal nocturnal dyspnea, syncope, palpitations, focal neurological deficits, intermittent claudication, lower extremity edema, unexplained weight gain, cough, hemoptysis or wheezing.  The patient also denies abdominal pain, nausea, vomiting, dysphagia, diarrhea, constipation, polyuria, polydipsia, dysuria, hematuria, frequency, urgency, abnormal bleeding or bruising, fever, chills, unexpected weight changes, mood swings, change in skin or hair texture, change in voice quality, auditory or visual problems, allergic reactions or rashes, new musculoskeletal complaints other than usual "aches and pains".   PHYSICAL EXAM BP 140/78  Pulse 56  Ht 6' (1.829 m)  Wt 183 lb 4.8 oz (83.144 kg)  BMI 24.85 kg/m2  General: Alert, oriented x3, no distress Head: no evidence of trauma, PERRL, EOMI, no exophtalmos or lid lag, no myxedema, no xanthelasma; normal ears, nose and oropharynx Neck: normal jugular venous pulsations and no hepatojugular reflux; brisk carotid pulses without delay and no carotid bruits Chest: clear to auscultation, no signs of consolidation by percussion or palpation, normal fremitus, symmetrical and full respiratory excursions Cardiovascular: normal position and quality of the apical impulse, regular rhythm, normal first and second heart sounds, no rubs or gallops. Early peaking grade 2/6 aortic ejection murmur radiating towards the carotids Abdomen: no tenderness or distention, no masses by palpation, no abnormal pulsatility or arterial bruits, normal bowel sounds, no hepatosplenomegaly Extremities: no clubbing, cyanosis or edema; 2+ radial, ulnar and brachial pulses bilaterally; 2+ right femoral, posterior tibial and dorsalis pedis pulses; 2+ left femoral, posterior  tibial and dorsalis pedis pulses; no subclavian or femoral bruits Neurological: grossly nonfocal   EKG: "normal"  BMET    Component Value Date/Time   NA 140 04/02/2013 1055   K 4.4 04/02/2013 1055   CL 104 04/02/2013 1055   CO2 26 04/02/2013 1055   GLUCOSE 93 04/02/2013 1055   BUN 26* 04/02/2013 1055   CREATININE 1.16 04/08/2013 1538   CALCIUM 9.7 04/02/2013 1055   GFRNONAA 58* 04/08/2013 1538   GFRAA 67* 04/08/2013 1538     ASSESSMENT AND PLAN Aortic aneurysm of unspecified site without mention of rupture We'll need to get the records from [place] city. I doubt that the real dimension of the aneurysm was 2 cm. I suspect that the area of dilatation might have been the aortic root since it sounds like  the initial study was in echocardiogram. We'll check an echo since this will also allow for evaluation of his murmur. Further imaging studies will depend on the information we received from his previous physicians.  Dyspnea His symptoms of dyspnea would best evaluated with a cardiopulmonary stress test. This will allow for simultaneous evaluation of pulmonary and cardiac function and the best path towards further diagnosis. An echo will also be helpful.  HTN (hypertension) Fair control. If he indeed has aneurysm of the aorta beta blockers may be a consideration, but his resting bradycardia may prevent their use. Losartan may be beneficial in preventing aneurysm progression and should be continued.  Hyperlipidemia Need to get his latest lipid profile report   Orders Placed This Encounter  Procedures  . CPET WITH PFT (MET TEST)   Meds ordered this encounter  Medications  . OVER THE COUNTER MEDICATION    Sig: Omega Red Takes M/W/F    Junious Silk, MD, [facility] [phone] office [phone] pager

## 2013-05-19 ENCOUNTER — Encounter (INDEPENDENT_AMBULATORY_CARE_PROVIDER_SITE_OTHER): Payer: Medicare Other

## 2013-05-19 DIAGNOSIS — R0602 Shortness of breath: Secondary | ICD-10-CM

## 2013-06-10 ENCOUNTER — Ambulatory Visit (INDEPENDENT_AMBULATORY_CARE_PROVIDER_SITE_OTHER): Payer: Medicare Other | Admitting: Cardiovascular Disease

## 2013-06-10 ENCOUNTER — Encounter: Payer: Self-pay | Admitting: Cardiovascular Disease

## 2013-06-10 VITALS — BP 170/82 | HR 72 | Ht 72.0 in | Wt 188.2 lb

## 2013-06-10 DIAGNOSIS — I719 Aortic aneurysm of unspecified site, without rupture: Secondary | ICD-10-CM

## 2013-06-10 DIAGNOSIS — I10 Essential (primary) hypertension: Secondary | ICD-10-CM

## 2013-06-10 DIAGNOSIS — R9439 Abnormal result of other cardiovascular function study: Secondary | ICD-10-CM

## 2013-06-10 NOTE — Patient Instructions (Addendum)
Please check your blood pressure once daily after sitting down in a chair for at least 10 minutes and bring your blood pressure monitor to your next office appointment. Your physician recommends that you schedule a follow-up appointment in: 6 weeks

## 2013-06-16 DIAGNOSIS — R9439 Abnormal result of other cardiovascular function study: Secondary | ICD-10-CM | POA: Insufficient documentation

## 2013-06-16 NOTE — Assessment & Plan Note (Signed)
There is some discrepancy between his home blood pressure recordings at what we have seen in the office. My feeling is that he does not have well treated hypertension. He did have some orthostatic hypotension while on direct therapy. He would probably benefit from addition of a beta blocker especially seen in the ischemic response of his court-appointed stress test. He'll bring me a list of his home blood pressure recordings to his next appointment. Note that his baseline heart rate was relatively slow rate at 60 beats per minute and he may not tolerate a lot of beta blocker.

## 2013-06-16 NOTE — Assessment & Plan Note (Signed)
If we cannot get the report of his previous studies we may have to reevaluate him with our own imaging procedures. It is hard to know which test to do since he cannot remember exactly where he was told the aneurysm was located

## 2013-06-16 NOTE — Assessment & Plan Note (Signed)
We discussed different options for further evaluation including proceeding directly to cardiac catheterization (which I think would be excessive in this asymptomatic patient) or performing a nuclear stress test. He's not ready to make a decision personally I think a nuclear stress test would be a judicious approach.

## 2013-06-16 NOTE — Progress Notes (Signed)
Patient ID: Bill Foley, male   DOB: 02/23/1931, 79 y.o.   MRN: [id number]     Reason for office visit Followup hypertension, aortic aneurysm, hyperlipidemia Cardio pulmonary stress test results review  Bill Foley is remarkably active 79 year old man who returns for followup for oral cardiovascular health and management of his hypertension. His diastolic blood pressure was low and he complained of occasional "fuzziness" when he stands up, consistent with orthostatic hypotension. His primary care physician discontinued his diuretic. Today in the clinic his blood pressure is 170/82 and when I rechecked it 15 minutes later was still very high at 200/90 mm Hg. His home blood pressure monitor shows recordings per his report between 120 135/60-65. He tells me that he has had his home blood pressure monitor checked in a doctor's office.  He underwent a cardiopulmonary stress test and the major indicators are within normal range. He put in a good effort and his peak VO2 was 17.5 mL per kilogram per minute consistent with 83% of maximum predicted. He speaks stroke volume was within the normal range he achieved a peak heart rate at 86% of maximum predicted. His anaerobic threshold was 10.2 mL per kilogram per minute, right at the lower limit of normal. The only truly concerning finding on the study was the fact that his stroke volume curve plateaus the right at the anaerobic threshold and even declined slightly, so that most of his stroke volume increases based on heart rate increase. This does suggest an ischemic response. He denies angina pectoris and there were no ECG changes during the treadmill stress test. Systolic blood pressure peaked at 210 during the stress test.  His spirometry is normal with FVC and FEV1 values actually exceeded the predicted value for his age. The diffusion capacity is normal.  Unfortunately we still have not retrieved his old records and we do not have any documentation of the  reported aortic aneurysm.    No Known Allergies  Current Outpatient Prescriptions  Medication Sig Dispense Refill  . aspirin 81 MG tablet Take 81 mg by mouth 2 (two) times a week.       . hydrALAZINE (APRESOLINE) 100 MG tablet Take 100 mg by mouth 2 (two) times daily.      Marland Kitchen ibuprofen (ADVIL,MOTRIN) 200 MG tablet Take 200 mg by mouth every 6 (six) hours as needed for pain.      Boris Lown Oil 300 MG CAPS Take 300 mg by mouth every Monday, Wednesday, and Friday.       . losartan (COZAAR) 100 MG tablet Take 100 mg by mouth every morning.       Marland Kitchen OVER THE COUNTER MEDICATION Omega Red Takes M/W/F      . pravastatin (PRAVACHOL) 40 MG tablet Take 40 mg by mouth every evening.       . chlorthalidone (HYGROTON) 25 MG tablet Take 12.5 mg by mouth every evening.        No current facility-administered medications for this visit.    Past Medical History  Diagnosis Date  . Basal cell carcinoma   . Hyperlipidemia   . Tinnitus   . Hypertension   . Aortic aneurysm   . Aortic aneurysm     "JUST UNDER 2 CM"  . Shortness of breath     WITH EXERTION  . GERD (gastroesophageal reflux disease)     OCCASIONAL  . Gallstones   . Weak urinary stream   . Arthritis     RT WRIST  . Slow  heart rate     "has been down to 45 in the past"    Past Surgical History  Procedure Laterality Date  . Tonsillectomy    . Vasectomy    . Total knee arthroplasty  2007    RT  . Ankle surgery Left 1975  . Cholecystectomy N/A 04/08/2013    Procedure: LAPAROSCOPIC CHOLECYSTECTOMY WITH INTRAOPERATIVE CHOLANGIOGRAM;  Surgeon: Wilmon Arms. Corliss Skains, MD;  Location: [facility];  Service: General;  Laterality: N/A;  . Laparoscopic appendectomy N/A 04/08/2013    Procedure: LAPAROSCOPIC PARTIAL CECECTOMY;  Surgeon: Wilmon Arms. Corliss Skains, MD;  Location: [facility];  Service: General;  Laterality: N/A;    No family history on file.  History   Social History  . Marital Status: Married    Spouse Name: N/A    Number of Children: N/A  .  Years of Education: N/A   Occupational History  . Not on file.   Social History Main Topics  . Smoking status: Former Smoker    Quit date: 03/11/1963  . Smokeless tobacco: Not on file  . Alcohol Use: 0.0 oz/week    5-7 Glasses of wine per week  . Drug Use: No  . Sexual Activity: Not on file   Other Topics Concern  . Not on file   Social History Narrative  . No narrative on file    Review of systems: The patient specifically denies any chest pain at rest or with exertion, dyspnea at rest or with exertion, orthopnea, paroxysmal nocturnal dyspnea, syncope, palpitations, focal neurological deficits, intermittent claudication, lower extremity edema, unexplained weight gain, cough, hemoptysis or wheezing.  The patient also denies abdominal pain, nausea, vomiting, dysphagia, diarrhea, constipation, polyuria, polydipsia, dysuria, hematuria, frequency, urgency, abnormal bleeding or bruising, fever, chills, unexpected weight changes, mood swings, change in skin or hair texture, change in voice quality, auditory or visual problems, allergic reactions or rashes, new musculoskeletal complaints other than usual "aches and pains".   PHYSICAL EXAM BP 170/82  Pulse 72  Ht 6' (1.829 m)  Wt 188 lb 3.2 oz (85.367 kg)  BMI 25.52 kg/m2 General: Alert, oriented x3, no distress  Head: no evidence of trauma, PERRL, EOMI, no exophtalmos or lid lag, no myxedema, no xanthelasma; normal ears, nose and oropharynx  Neck: normal jugular venous pulsations and no hepatojugular reflux; brisk carotid pulses without delay and no carotid bruits  Chest: clear to auscultation, no signs of consolidation by percussion or palpation, normal fremitus, symmetrical and full respiratory excursions  Cardiovascular: normal position and quality of the apical impulse, regular rhythm, normal first and second heart sounds, no rubs or gallops. Early peaking grade 2/6 aortic ejection murmur radiating towards the carotids  Abdomen: no  tenderness or distention, no masses by palpation, no abnormal pulsatility or arterial bruits, normal bowel sounds, no hepatosplenomegaly  Extremities: no clubbing, cyanosis or edema; 2+ radial, ulnar and brachial pulses bilaterally; 2+ right femoral, posterior tibial and dorsalis pedis pulses; 2+ left femoral, posterior tibial and dorsalis pedis pulses; no subclavian or femoral bruits  Neurological: grossly nonfocal   Lipid Panel  No results found for this basename: chol, trig, hdl, cholhdl, vldl, ldlcalc    BMET    Component Value Date/Time   NA 140 04/02/2013 1055   K 4.4 04/02/2013 1055   CL 104 04/02/2013 1055   CO2 26 04/02/2013 1055   GLUCOSE 93 04/02/2013 1055   BUN 26* 04/02/2013 1055   CREATININE 1.16 04/08/2013 1538   CALCIUM 9.7 04/02/2013 1055   GFRNONAA 58*  04/08/2013 1538   GFRAA 67* 04/08/2013 1538     ASSESSMENT AND PLAN HTN (hypertension) There is some discrepancy between his home blood pressure recordings at what we have seen in the office. My feeling is that he does not have well treated hypertension. He did have some orthostatic hypotension while on direct therapy. He would probably benefit from addition of a beta blocker especially seen in the ischemic response of his court-appointed stress test. He'll bring me a list of his home blood pressure recordings to his next appointment. Note that his baseline heart rate was relatively slow rate at 60 beats per minute and he may not tolerate a lot of beta blocker.  Abnormal stress electrocardiogram test using treadmill We discussed different options for further evaluation including proceeding directly to cardiac catheterization (which I think would be excessive in this asymptomatic patient) or performing a nuclear stress test. He's not ready to make a decision personally I think a nuclear stress test would be a judicious approach.  Aortic aneurysm of unspecified site without mention of rupture If we cannot get the report of his  previous studies we may have to reevaluate him with our own imaging procedures. It is hard to know which test to do since he cannot remember exactly where he was told the aneurysm was located   No orders of the defined types were placed in this encounter.   No orders of the defined types were placed in this encounter.    Junious Silk, MD, [facility] [phone] office [phone] pager

## 2013-07-21 ENCOUNTER — Ambulatory Visit (INDEPENDENT_AMBULATORY_CARE_PROVIDER_SITE_OTHER): Payer: Medicare Other | Admitting: Surgery

## 2013-07-21 ENCOUNTER — Encounter (INDEPENDENT_AMBULATORY_CARE_PROVIDER_SITE_OTHER): Payer: Self-pay | Admitting: Surgery

## 2013-07-21 VITALS — BP 118/66 | HR 56 | Resp 16 | Ht 72.0 in | Wt 183.6 lb

## 2013-07-21 DIAGNOSIS — K4020 Bilateral inguinal hernia, without obstruction or gangrene, not specified as recurrent: Secondary | ICD-10-CM

## 2013-07-21 NOTE — Progress Notes (Signed)
HPI  This is an 79 year old male in good health who presents with bilateral inguinal hernias.  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  He is doing quite well after surgery.   The patient also reports an enlarging bulge in his left groin that has been present for about 6 years. Previous to his knee surgery, he was a fairly active weight lifter. This has continued to enlarge and has become uncomfortable. It remains reducible. He denies any right-sided symptoms, but does have a small right inguinal hernia.  The patient is remarkably healthy for his age and remains very active.  Past Medical History   Diagnosis  Date   .  Blood in stool    .  Basal cell carcinoma    .  Hyperlipidemia    .  Tinnitus    .  Hypertension    .  Glaucoma    .  Arthritis     Past Surgical History   Procedure  Laterality  Date   .  Hernia repair     .  Tonsillectomy     .  Vasectomy     .  Total knee arthroplasty     .  Ankle surgery  Left  1975   .  Colon surgery   12/16/2012   Colonoscopy with polypectomy  History reviewed. No pertinent family history.  Social History  History   Substance Use Topics   .  Smoking status:  Former Smoker     Quit date:  03/11/1963   .  Smokeless tobacco:  Not on file   .  Alcohol Use:  0.0 oz/week     5-7 Glasses of wine per week   No Known Allergies  Current Outpatient Prescriptions   Medication  Sig  Dispense  Refill   .  aspirin 81 MG tablet  Take 81 mg by mouth daily.     .  chlorthalidone (HYGROTON) 25 MG tablet  Take 25 mg by mouth daily.     .  hydrALAZINE (APRESOLINE) 100 MG tablet  Take 100 mg by mouth 2 (two) times daily.     Marland Kitchen  ibuprofen (ADVIL,MOTRIN) 200 MG tablet  Take 200 mg by mouth every 6 (six) hours as needed for pain.     Boris Lown Oil 300 MG CAPS  Take by mouth.     .  losartan (COZAAR) 100 MG tablet  Take 100 mg by mouth daily.     .  pravastatin (PRAVACHOL) 40 MG  tablet  Take 40 mg by mouth daily.      No current facility-administered medications for this visit.   Review of Systems  Review of Systems  Constitutional: Negative for fever, chills and unexpected weight change.  HENT: Negative for hearing loss, congestion, sore throat, trouble swallowing and voice change.  Eyes: Negative for visual disturbance.  Respiratory: Negative for cough and wheezing.  Cardiovascular: Negative for chest pain, palpitations and leg swelling.  Gastrointestinal: Positive for nausea, vomiting, abdominal pain and blood in stool. Negative for diarrhea, constipation, abdominal distention, anal bleeding and rectal pain.  Genitourinary: Positive for scrotal swelling. Negative for hematuria and difficulty urinating.  Musculoskeletal: Negative for arthralgias.  Skin: Negative for rash and wound.  Neurological: Negative for seizures, syncope, weakness and headaches.  Hematological: Negative for adenopathy. Does not bruise/bleed easily.  Psychiatric/Behavioral: Negative for confusion.  Blood pressure 120/58, pulse 64, temperature 99 F (37.2 C), temperature source Temporal,  resp. rate 16, height 6' (1.829 m), weight 182 lb (82.555 kg), peak flow 120 L/min.  Physical Exam  Physical Exam  WDWN in NAD  HEENT: EOMI, sclera anicteric  Neck: No masses, no thyromegaly  Lungs: CTA bilaterally; normal respiratory effort  CV: Regular rate and rhythm; no murmurs  Abd: +bowel sounds, soft, non-tender, no masses  GU: Bilateral descended testes; no testicular masses; large reducible left inguinal hernia; small reducible right inguinal hernia  Ext: Well-perfused; no edema  Skin: Warm, dry; no sign of jaundice  Data Reviewed  Colonoscopy report from Dr. Dulce Sellar.  Pathology report  Assessment  1.  Bilateral inguinal hernias  Plan  Bilateral inguinal hernias - reducible  Recommend open bilateral inguinal hernia repairs with mesh. The surgical procedure has been discussed with the  patient. Potential risks, benefits, alternative treatments, and expected outcomes have been explained. All of the patient's questions at this time have been answered. The likelihood of reaching the patient's treatment goal is good. The patient understand the proposed surgical procedure and wishes to proceed.

## 2013-07-23 ENCOUNTER — Ambulatory Visit: Payer: Medicare Other | Admitting: Cardiovascular Disease

## 2013-07-23 ENCOUNTER — Encounter: Payer: Self-pay | Admitting: Cardiovascular Disease

## 2013-07-23 ENCOUNTER — Ambulatory Visit (INDEPENDENT_AMBULATORY_CARE_PROVIDER_SITE_OTHER): Payer: Medicare Other | Admitting: Cardiovascular Disease

## 2013-07-23 VITALS — BP 170/72 | HR 72 | Ht 72.0 in | Wt 184.4 lb

## 2013-07-23 DIAGNOSIS — E785 Hyperlipidemia, unspecified: Secondary | ICD-10-CM

## 2013-07-23 DIAGNOSIS — I10 Essential (primary) hypertension: Secondary | ICD-10-CM

## 2013-07-23 DIAGNOSIS — I719 Aortic aneurysm of unspecified site, without rupture: Secondary | ICD-10-CM

## 2013-07-23 DIAGNOSIS — R9439 Abnormal result of other cardiovascular function study: Secondary | ICD-10-CM

## 2013-07-23 NOTE — Patient Instructions (Signed)
Your physician recommends that you schedule a follow-up appointment in: 12 months.  

## 2013-07-29 NOTE — Assessment & Plan Note (Signed)
Satisfactory control. His blood pressure appears to be at most borderline high. He frequently has diastolic blood pressure in the 50-55 range and I don't think it would be wise to put him on any additional antihypertensive medications. No changes made to his medicines today.

## 2013-07-29 NOTE — Assessment & Plan Note (Signed)
On statin therapy. We'll need to check his lipid profile at his next appointment.

## 2013-07-29 NOTE — Assessment & Plan Note (Signed)
Per his echocardiogram performed at [facility] he has a mildly dilated ascending aorta, which is hard to really qualify as being an aneurysm considering the reported aortic diameter. It sounds that further evaluation is not necessary at this time.

## 2013-07-29 NOTE — Progress Notes (Signed)
Patient ID: Bill Foley, male   DOB: [birth year], 79 y.o.   MRN: [id number]      Reason for office visit HTN, aortic aneurysm, hyperlipidemia  Mr. Rigg returns in followup. Unfortunately did not get full records from his previous physicians in [address], but we did receive a copy of his echocardiogram. I think this helps clarify the issue of his "small aortic aneurysm". His echocardiogram described a "mildly dilated ascending aorta" (even though the measurements on the echo report is only 3.5 cm which actually be normal). Additional useful information from his echocardiogram include the report of moderate concentric LVH, normal EF greater than 55%, impaired LV relaxation pattern by mitral inflow, moderate to severely dilated left atrium, mild to moderately dilated right atrium, aneurysmal atrial septum, aortic valve sclerosis without stenosis.  He feels great. He has been riding his motorcycle for thousands of miles and has no complaints. He has been carefully monitoring his blood pressure and heart rate at home. The range of blood pressures has been 133/56-165/67, with most of the systolic blood pressures being under 140 mm Hg. We checked his blood pressure monitoring the office today and it is virtually identical readings at our office in the monitor. His home monitor has reported heart rates that are often in the 40s and usually in the 50s. Denies fatigue dizziness or syncope.  He is due to have bilateral inguinal hernia repair in a few days.  We again reviewed the rather complicated and somewhat contradictory data retrieved from his cardiopulmonary stress test. He states that he is completely asymptomatic and prefers a conservative approach.   No Known Allergies  Current Outpatient Prescriptions  Medication Sig Dispense Refill  . aspirin 81 MG tablet Take 81 mg by mouth 2 (two) times a week.       . hydrALAZINE (APRESOLINE) 100 MG tablet Take 100 mg by mouth 2 (two) times  daily.      Marland Kitchen ibuprofen (ADVIL,MOTRIN) 200 MG tablet Take 200 mg by mouth every 6 (six) hours as needed for pain.      Boris Lown Oil 300 MG CAPS Take 300 mg by mouth every Monday, Wednesday, and Friday.       . losartan (COZAAR) 100 MG tablet Take 100 mg by mouth every morning.       Marland Kitchen OVER THE COUNTER MEDICATION Omega Red Takes M/W/F      . pravastatin (PRAVACHOL) 40 MG tablet Take 40 mg by mouth every evening.        No current facility-administered medications for this visit.    Past Medical History  Diagnosis Date  . Basal cell carcinoma   . Hyperlipidemia   . Tinnitus   . Hypertension   . Aortic aneurysm   . Aortic aneurysm     "JUST UNDER 2 CM"  . Shortness of breath     WITH EXERTION  . GERD (gastroesophageal reflux disease)     OCCASIONAL  . Gallstones   . Weak urinary stream   . Arthritis     RT WRIST  . Slow heart rate     "has been down to 45 in the past"    Past Surgical History  Procedure Laterality Date  . Tonsillectomy    . Vasectomy    . Total knee arthroplasty  2007    RT  . Ankle surgery Left 1975  . Cholecystectomy N/A 04/08/2013    Procedure: LAPAROSCOPIC CHOLECYSTECTOMY WITH INTRAOPERATIVE CHOLANGIOGRAM;  Surgeon: Wilmon Arms. Tsuei, MD;  Location: [facility];  Service: General;  Laterality: N/A;  . Laparoscopic appendectomy N/A 04/08/2013    Procedure: LAPAROSCOPIC PARTIAL CECECTOMY;  Surgeon: Wilmon Arms. Corliss Skains, MD;  Location: [facility];  Service: General;  Laterality: N/A;    No family history on file.  History   Social History  . Marital Status: Married    Spouse Name: N/A    Number of Children: N/A  . Years of Education: N/A   Occupational History  . Not on file.   Social History Main Topics  . Smoking status: Former Smoker    Quit date: 03/11/1963  . Smokeless tobacco: Not on file  . Alcohol Use: 0.0 oz/week    5-7 Glasses of wine per week  . Drug Use: No  . Sexual Activity: Not on file   Other Topics Concern  . Not on file   Social  History Narrative  . No narrative on file    Review of systems: The patient specifically denies any chest pain at rest or with exertion, dyspnea at rest or with exertion, orthopnea, paroxysmal nocturnal dyspnea, syncope, palpitations, focal neurological deficits, intermittent claudication, lower extremity edema, unexplained weight gain, cough, hemoptysis or wheezing.  The patient also denies abdominal pain, nausea, vomiting, dysphagia, diarrhea, constipation, polyuria, polydipsia, dysuria, hematuria, frequency, urgency, abnormal bleeding or bruising, fever, chills, unexpected weight changes, mood swings, change in skin or hair texture, change in voice quality, auditory or visual problems, allergic reactions or rashes, new musculoskeletal complaints other than usual "aches and pains".    PHYSICAL EXAM BP 170/72  Pulse 72  Ht 6' (1.829 m)  Wt 184 lb 6.4 oz (83.643 kg)  BMI 25.00 kg/m2  General: Alert, oriented x3, no distress  Head: no evidence of trauma, PERRL, EOMI, no exophtalmos or lid lag, no myxedema, no xanthelasma; normal ears, nose and oropharynx  Neck: normal jugular venous pulsations and no hepatojugular reflux; brisk carotid pulses without delay and no carotid bruits  Chest: clear to auscultation, no signs of consolidation by percussion or palpation, normal fremitus, symmetrical and full respiratory excursions  Cardiovascular: normal position and quality of the apical impulse, regular rhythm, normal first and widely split second heart sounds, no rubs or gallops. Early peaking grade 2/6 aortic ejection murmur radiating towards the carotids  Abdomen: no tenderness or distention, no masses by palpation, no abnormal pulsatility or arterial bruits, normal bowel sounds, no hepatosplenomegaly  Extremities: no clubbing, cyanosis or edema; 2+ radial, ulnar and brachial pulses bilaterally; 2+ right femoral, posterior tibial and dorsalis pedis pulses; 2+ left femoral, posterior tibial and  dorsalis pedis pulses; no subclavian or femoral bruits  Neurological: grossly nonfocal    EKG: Sinus rhythm, right bundle branch block, cannot exclude old inferior infarct, unchanged from 2012   BMET    Component Value Date/Time   NA 140 04/02/2013 1055   K 4.4 04/02/2013 1055   CL 104 04/02/2013 1055   CO2 26 04/02/2013 1055   GLUCOSE 93 04/02/2013 1055   BUN 26* 04/02/2013 1055   CREATININE 1.16 04/08/2013 1538   CALCIUM 9.7 04/02/2013 1055   GFRNONAA 58* 04/08/2013 1538   GFRAA 67* 04/08/2013 1538     ASSESSMENT AND PLAN Aortic aneurysm of unspecified site without mention of rupture Per his echocardiogram performed at [facility] he has a mildly dilated ascending aorta, which is hard to really qualify as being an aneurysm considering the reported aortic diameter. It sounds that further evaluation is not necessary at this time.  Abnormal stress electrocardiogram  test using treadmill It is reasonable to defer further workup in this asymptomatic, very active 79 year old gentleman.  Hyperlipidemia On statin therapy. We'll need to check his lipid profile at his next appointment.  HTN (hypertension) Satisfactory control. His blood pressure appears to be at most borderline high. He frequently has diastolic blood pressure in the 50-55 range and I don't think it would be wise to put him on any additional antihypertensive medications. No changes made to his medicines today.   Patient Instructions  Your physician recommends that you schedule a follow-up appointment in: 12 months     Leandria Thier  Thurmon Fair, MD, [facility] [phone] office [phone] pager

## 2013-07-29 NOTE — Assessment & Plan Note (Signed)
It is reasonable to defer further workup in this asymptomatic, very active 79 year old gentleman.

## 2013-07-31 ENCOUNTER — Encounter (HOSPITAL_BASED_OUTPATIENT_CLINIC_OR_DEPARTMENT_OTHER): Payer: Self-pay | Admitting: *Deleted

## 2013-07-31 NOTE — Progress Notes (Signed)
To come in for bmet-had ekg 7/14 when he had lap chli and append-saw dr crouitoru for clearance

## 2013-08-03 ENCOUNTER — Encounter (HOSPITAL_BASED_OUTPATIENT_CLINIC_OR_DEPARTMENT_OTHER)
Admission: RE | Admit: 2013-08-03 | Discharge: 2013-08-03 | Disposition: A | Discharge status: Home / Self Care | Payer: Medicare Other | Source: Ambulatory Visit | Attending: Surgery | Admitting: Surgery

## 2013-08-03 LAB — BASIC METABOLIC PANEL
BUN: 16 mg/dL (ref 6–23)
Creatinine, Ser: 1.03 mg/dL (ref 0.50–1.35)
GFR calc Af Amer: 77 mL/min — ABNORMAL LOW (ref >90)
GFR calc non Af Amer: 66 mL/min — ABNORMAL LOW (ref >90)
Glucose, Bld: 111 mg/dL — ABNORMAL HIGH (ref 70–99)
Sodium: 142 mEq/L (ref 135–145)

## 2013-08-06 ENCOUNTER — Ambulatory Visit (HOSPITAL_BASED_OUTPATIENT_CLINIC_OR_DEPARTMENT_OTHER)
Admission: RE | Admit: 2013-08-06 | Discharge: 2013-08-06 | Disposition: A | Discharge status: Home / Self Care | Payer: Medicare Other | Source: Ambulatory Visit | Attending: Surgery | Admitting: Surgery

## 2013-08-06 ENCOUNTER — Encounter (HOSPITAL_BASED_OUTPATIENT_CLINIC_OR_DEPARTMENT_OTHER): Payer: Self-pay | Admitting: *Deleted

## 2013-08-06 ENCOUNTER — Encounter (HOSPITAL_BASED_OUTPATIENT_CLINIC_OR_DEPARTMENT_OTHER): Payer: Medicare Other | Admitting: Anesthesiology

## 2013-08-06 ENCOUNTER — Ambulatory Visit (HOSPITAL_BASED_OUTPATIENT_CLINIC_OR_DEPARTMENT_OTHER): Payer: Medicare Other | Admitting: Anesthesiology

## 2013-08-06 ENCOUNTER — Encounter (HOSPITAL_BASED_OUTPATIENT_CLINIC_OR_DEPARTMENT_OTHER)
Admission: RE | Disposition: A | Discharge status: Home / Self Care | Payer: Self-pay | Source: Ambulatory Visit | Attending: Surgery

## 2013-08-06 DIAGNOSIS — E785 Hyperlipidemia, unspecified: Secondary | ICD-10-CM | POA: Insufficient documentation

## 2013-08-06 DIAGNOSIS — Z7982 Long term (current) use of aspirin: Secondary | ICD-10-CM | POA: Insufficient documentation

## 2013-08-06 DIAGNOSIS — I10 Essential (primary) hypertension: Secondary | ICD-10-CM | POA: Insufficient documentation

## 2013-08-06 DIAGNOSIS — K4090 Unilateral inguinal hernia, without obstruction or gangrene, not specified as recurrent: Secondary | ICD-10-CM | POA: Insufficient documentation

## 2013-08-06 DIAGNOSIS — Z87891 Personal history of nicotine dependence: Secondary | ICD-10-CM | POA: Insufficient documentation

## 2013-08-06 DIAGNOSIS — K4020 Bilateral inguinal hernia, without obstruction or gangrene, not specified as recurrent: Secondary | ICD-10-CM

## 2013-08-06 DIAGNOSIS — Z01812 Encounter for preprocedural laboratory examination: Secondary | ICD-10-CM | POA: Insufficient documentation

## 2013-08-06 DIAGNOSIS — K219 Gastro-esophageal reflux disease without esophagitis: Secondary | ICD-10-CM | POA: Insufficient documentation

## 2013-08-06 HISTORY — PX: INGUINAL HERNIA REPAIR: SHX194

## 2013-08-06 SURGERY — REPAIR, HERNIA, INGUINAL, BILATERAL, ADULT
Anesthesia: General | Laterality: Bilateral | Wound class: Clean

## 2013-08-06 MED ORDER — CEFAZOLIN SODIUM-DEXTROSE 2-3 GM-% IV SOLR
2.0000 g | INTRAVENOUS | Status: AC
  Administered 2013-08-06: 2 g via INTRAVENOUS

## 2013-08-06 MED ORDER — HYDROCODONE-ACETAMINOPHEN 5-325 MG PO TABS: 1.0000 | ORAL_TABLET | ORAL | Status: DC | PRN

## 2013-08-06 MED ORDER — OXYCODONE HCL 5 MG PO TABS
5.0000 mg | ORAL_TABLET | Freq: Once | ORAL | Status: AC | PRN
  Administered 2013-08-06: 5 mg via ORAL

## 2013-08-06 MED ORDER — FENTANYL CITRATE 0.05 MG/ML IJ SOLN
INTRAMUSCULAR | Status: AC
  Filled 2013-08-06: qty 6

## 2013-08-06 MED ORDER — PHENYLEPHRINE HCL 10 MG/ML IJ SOLN
10.0000 mg | INTRAVENOUS | Status: DC | PRN
  Administered 2013-08-06: 50 ug/min via INTRAVENOUS

## 2013-08-06 MED ORDER — HYDROMORPHONE HCL PF 1 MG/ML IJ SOLN
INTRAMUSCULAR | Status: AC
  Filled 2013-08-06: qty 1

## 2013-08-06 MED ORDER — OXYCODONE HCL 5 MG PO TABS
ORAL_TABLET | ORAL | Status: AC
  Filled 2013-08-06: qty 1

## 2013-08-06 MED ORDER — FENTANYL CITRATE 0.05 MG/ML IJ SOLN
INTRAMUSCULAR | Status: DC | PRN
  Administered 2013-08-06: 50 ug via INTRAVENOUS
  Administered 2013-08-06 (×2): 25 ug via INTRAVENOUS
  Administered 2013-08-06: 100 ug via INTRAVENOUS

## 2013-08-06 MED ORDER — PROPOFOL 10 MG/ML IV BOLUS
INTRAVENOUS | Status: DC | PRN
  Administered 2013-08-06: 180 mg via INTRAVENOUS

## 2013-08-06 MED ORDER — ONDANSETRON HCL 4 MG/2ML IJ SOLN: 4.0000 mg | INTRAMUSCULAR | Status: DC | PRN

## 2013-08-06 MED ORDER — HYDROMORPHONE HCL PF 1 MG/ML IJ SOLN
0.2500 mg | INTRAMUSCULAR | Status: DC | PRN
  Administered 2013-08-06 (×4): 0.5 mg via INTRAVENOUS

## 2013-08-06 MED ORDER — LACTATED RINGERS IV SOLN
INTRAVENOUS | Status: DC
  Administered 2013-08-06 (×2): via INTRAVENOUS

## 2013-08-06 MED ORDER — ONDANSETRON HCL 4 MG/2ML IJ SOLN
INTRAMUSCULAR | Status: DC | PRN
  Administered 2013-08-06: 4 mg via INTRAVENOUS

## 2013-08-06 MED ORDER — MIDAZOLAM HCL 2 MG/2ML IJ SOLN: 1.0000 mg | INTRAMUSCULAR | Status: DC | PRN

## 2013-08-06 MED ORDER — LIDOCAINE HCL (CARDIAC) 20 MG/ML IV SOLN
INTRAVENOUS | Status: DC | PRN
  Administered 2013-08-06: 50 mg via INTRAVENOUS

## 2013-08-06 MED ORDER — BUPIVACAINE HCL (PF) 0.25 % IJ SOLN
INTRAMUSCULAR | Status: AC
  Filled 2013-08-06: qty 30

## 2013-08-06 MED ORDER — CEFAZOLIN SODIUM-DEXTROSE 2-3 GM-% IV SOLR
INTRAVENOUS | Status: AC
  Filled 2013-08-06: qty 50

## 2013-08-06 MED ORDER — OXYCODONE HCL 5 MG/5ML PO SOLN: 5.0000 mg | Freq: Once | ORAL | Status: AC | PRN

## 2013-08-06 MED ORDER — ONDANSETRON HCL 4 MG/2ML IJ SOLN: 4.0000 mg | Freq: Four times a day (QID) | INTRAMUSCULAR | Status: DC | PRN

## 2013-08-06 MED ORDER — MORPHINE SULFATE 2 MG/ML IJ SOLN: 2.0000 mg | INTRAMUSCULAR | Status: DC | PRN

## 2013-08-06 MED ORDER — CHLORHEXIDINE GLUCONATE 4 % EX LIQD: 1.0000 "application " | Freq: Once | CUTANEOUS | Status: DC

## 2013-08-06 MED ORDER — BUPIVACAINE-EPINEPHRINE 0.25% -1:200000 IJ SOLN
INTRAMUSCULAR | Status: DC | PRN
  Administered 2013-08-06: 20 mL

## 2013-08-06 MED ORDER — FENTANYL CITRATE 0.05 MG/ML IJ SOLN: 50.0000 ug | INTRAMUSCULAR | Status: DC | PRN

## 2013-08-06 SURGICAL SUPPLY — 49 items
BENZOIN TINCTURE PRP APPL 2/3 (GAUZE/BANDAGES/DRESSINGS) ×2 IMPLANT
BLADE HEX COATED 2.75 (ELECTRODE) ×2 IMPLANT
BLADE SURG 15 STRL LF DISP TIS (BLADE) ×1 IMPLANT
BLADE SURG 15 STRL SS (BLADE) ×1
BLADE SURG ROTATE 9660 (MISCELLANEOUS) IMPLANT
CHLORAPREP W/TINT 26ML (MISCELLANEOUS) ×2 IMPLANT
COVER MAYO STAND STRL (DRAPES) ×2 IMPLANT
COVER TABLE BACK 60X90 (DRAPES) ×2 IMPLANT
DECANTER SPIKE VIAL GLASS SM (MISCELLANEOUS) ×2 IMPLANT
DRAIN PENROSE 1/2X12 LTX STRL (WOUND CARE) ×2 IMPLANT
DRAPE EXTREMITY T 121X128X90 (DRAPE) IMPLANT
DRAPE LAPAROTOMY TRNSV 102X78 (DRAPE) ×2 IMPLANT
DRAPE UTILITY XL STRL (DRAPES) ×2 IMPLANT
DRSG TEGADERM 4X4.75 (GAUZE/BANDAGES/DRESSINGS) ×4 IMPLANT
ELECT REM PT RETURN 9FT ADLT (ELECTROSURGICAL) ×2
ELECTRODE REM PT RTRN 9FT ADLT (ELECTROSURGICAL) ×1 IMPLANT
GAUZE SPONGE 4X4 12PLY STRL LF (GAUZE/BANDAGES/DRESSINGS) ×2 IMPLANT
GLOVE BIO SURGEON STRL SZ7 (GLOVE) ×2 IMPLANT
GLOVE BIOGEL PI IND STRL 7.5 (GLOVE) ×1 IMPLANT
GLOVE BIOGEL PI INDICATOR 7.5 (GLOVE) ×1
GLOVE ECLIPSE 6.5 STRL STRAW (GLOVE) ×4 IMPLANT
GOWN PREVENTION PLUS XLARGE (GOWN DISPOSABLE) ×4 IMPLANT
MESH PARIETEX PROGRIP LEFT (Mesh General) ×2 IMPLANT
MESH PARIETEX PROGRIP RIGHT (Mesh General) ×2 IMPLANT
NEEDLE HYPO 25X1 1.5 SAFETY (NEEDLE) ×2 IMPLANT
NS IRRIG 1000ML POUR BTL (IV SOLUTION) ×2 IMPLANT
PACK BASIN DAY SURGERY FS (CUSTOM PROCEDURE TRAY) ×2 IMPLANT
PENCIL BUTTON HOLSTER BLD 10FT (ELECTRODE) ×2 IMPLANT
SLEEVE SCD COMPRESS KNEE MED (MISCELLANEOUS) ×2 IMPLANT
SPONGE INTESTINAL PEANUT (DISPOSABLE) ×2 IMPLANT
SPONGE LAP 18X18 X RAY DECT (DISPOSABLE) IMPLANT
SPONGE LAP 4X18 X RAY DECT (DISPOSABLE) ×2 IMPLANT
STRIP CLOSURE SKIN 1/2X4 (GAUZE/BANDAGES/DRESSINGS) ×2 IMPLANT
SUT MON AB 4-0 PC3 18 (SUTURE) ×4 IMPLANT
SUT PDS AB 0 CT 36 (SUTURE) IMPLANT
SUT PROLENE 2 0 SH DA (SUTURE) ×4 IMPLANT
SUT SILK 2 0 SH (SUTURE) IMPLANT
SUT SILK 3 0 SH 30 (SUTURE) IMPLANT
SUT SILK 3 0 TIES 17X18 (SUTURE)
SUT SILK 3-0 18XBRD TIE BLK (SUTURE) IMPLANT
SUT VIC AB 0 CT1 27 (SUTURE) ×1
SUT VIC AB 0 CT1 27XBRD ANBCTR (SUTURE) ×1 IMPLANT
SUT VIC AB 0 SH 27 (SUTURE) ×2 IMPLANT
SUT VIC AB 2-0 SH 27 (SUTURE) ×2
SUT VIC AB 2-0 SH 27XBRD (SUTURE) ×2 IMPLANT
SUT VIC AB 3-0 SH 27 (SUTURE) ×2
SUT VIC AB 3-0 SH 27X BRD (SUTURE) ×2 IMPLANT
SYR CONTROL 10ML LL (SYRINGE) ×2 IMPLANT
TOWEL OR 17X24 6PK STRL BLUE (TOWEL DISPOSABLE) ×2 IMPLANT

## 2013-08-06 NOTE — Transfer of Care (Signed)
Immediate Anesthesia Transfer of Care Note  Patient: Bill Foley  Procedure(s) Performed: Procedure(s): HERNIA REPAIR INGUINAL ADULT BILATERAL (Bilateral)  Patient Location: PACU  Anesthesia Type:General  Level of Consciousness: sedated  Airway & Oxygen Therapy: Patient Spontanous Breathing and Patient connected to face mask oxygen  Post-op Assessment: Report given to PACU RN and Post -op Vital signs reviewed and stable  Post vital signs: Reviewed and stable  Complications: No apparent anesthesia complications

## 2013-08-06 NOTE — H&P (View-Only) (Signed)
HPI  This is an 79-year-old male in good health who presents with bilateral inguinal hernias.  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  He is doing quite well after surgery.   The patient also reports an enlarging bulge in his left groin that has been present for about 6 years. Previous to his knee surgery, he was a fairly active weight lifter. This has continued to enlarge and has become uncomfortable. It remains reducible. He denies any right-sided symptoms, but does have a small right inguinal hernia.  The patient is remarkably healthy for his age and remains very active.  Past Medical History   Diagnosis  Date   .  Blood in stool    .  Basal cell carcinoma    .  Hyperlipidemia    .  Tinnitus    .  Hypertension    .  Glaucoma    .  Arthritis     Past Surgical History   Procedure  Laterality  Date   .  Hernia repair     .  Tonsillectomy     .  Vasectomy     .  Total knee arthroplasty     .  Ankle surgery  Left  1975   .  Colon surgery   12/16/2012   Colonoscopy with polypectomy  History reviewed. No pertinent family history.  Social History  History   Substance Use Topics   .  Smoking status:  Former Smoker     Quit date:  03/11/1963   .  Smokeless tobacco:  Not on file   .  Alcohol Use:  0.0 oz/week     5-7 Glasses of wine per week   No Known Allergies  Current Outpatient Prescriptions   Medication  Sig  Dispense  Refill   .  aspirin 81 MG tablet  Take 81 mg by mouth daily.     .  chlorthalidone (HYGROTON) 25 MG tablet  Take 25 mg by mouth daily.     .  hydrALAZINE (APRESOLINE) 100 MG tablet  Take 100 mg by mouth 2 (two) times daily.     .  ibuprofen (ADVIL,MOTRIN) 200 MG tablet  Take 200 mg by mouth every 6 (six) hours as needed for pain.     .  Krill Oil 300 MG CAPS  Take by mouth.     .  losartan (COZAAR) 100 MG tablet  Take 100 mg by mouth daily.     .  pravastatin (PRAVACHOL) 40 MG  tablet  Take 40 mg by mouth daily.      No current facility-administered medications for this visit.   Review of Systems  Review of Systems  Constitutional: Negative for fever, chills and unexpected weight change.  HENT: Negative for hearing loss, congestion, sore throat, trouble swallowing and voice change.  Eyes: Negative for visual disturbance.  Respiratory: Negative for cough and wheezing.  Cardiovascular: Negative for chest pain, palpitations and leg swelling.  Gastrointestinal: Positive for nausea, vomiting, abdominal pain and blood in stool. Negative for diarrhea, constipation, abdominal distention, anal bleeding and rectal pain.  Genitourinary: Positive for scrotal swelling. Negative for hematuria and difficulty urinating.  Musculoskeletal: Negative for arthralgias.  Skin: Negative for rash and wound.  Neurological: Negative for seizures, syncope, weakness and headaches.  Hematological: Negative for adenopathy. Does not bruise/bleed easily.  Psychiatric/Behavioral: Negative for confusion.  Blood pressure 120/58, pulse 64, temperature 99 F (37.2 C), temperature source Temporal,   resp. rate 16, height 6' (1.829 m), weight 182 lb (82.555 kg), peak flow 120 L/min.  Physical Exam  Physical Exam  WDWN in NAD  HEENT: EOMI, sclera anicteric  Neck: No masses, no thyromegaly  Lungs: CTA bilaterally; normal respiratory effort  CV: Regular rate and rhythm; no murmurs  Abd: +bowel sounds, soft, non-tender, no masses  GU: Bilateral descended testes; no testicular masses; large reducible left inguinal hernia; small reducible right inguinal hernia  Ext: Well-perfused; no edema  Skin: Warm, dry; no sign of jaundice  Data Reviewed  Colonoscopy report from Dr. Outlaw.  Pathology report  Assessment  1.  Bilateral inguinal hernias  Plan  Bilateral inguinal hernias - reducible  Recommend open bilateral inguinal hernia repairs with mesh. The surgical procedure has been discussed with the  patient. Potential risks, benefits, alternative treatments, and expected outcomes have been explained. All of the patient's questions at this time have been answered. The likelihood of reaching the patient's treatment goal is good. The patient understand the proposed surgical procedure and wishes to proceed.     

## 2013-08-06 NOTE — Op Note (Signed)
Hernia, Open, Procedure Note  Indications: The patient presented with a history of a bilateral, reducible inguinal hernia.    Pre-operative Diagnosis: bilateral reducible inguinal hernia Post-operative Diagnosis: same  Surgeon: Wynona Luna.   Assistants: none  Anesthesia: General LMA anesthesia  ASA Class: 2  Procedure Details  The patient was seen again in the Holding Room. The risks, benefits, complications, treatment options, and expected outcomes were discussed with the patient. The possibilities of reaction to medication, pulmonary aspiration, perforation of viscus, bleeding, recurrent infection, the need for additional procedures, and development of a complication requiring transfusion or further operation were discussed with the patient and/or family. The likelihood of success in repairing the hernia and returning the patient to their previous functional status is good.  There was concurrence with the proposed plan, and informed consent was obtained. The site of surgery was properly noted/marked. The patient was taken to the Operating Room, identified as Bill Foley, and the procedure verified as bilateral inguinal hernia repair. A Time Out was held and the above information confirmed.  The patient was placed in the supine position and underwent induction of anesthesia. The lower abdomen and groin was prepped with Chloraprep and draped in the standard fashion, and 0.25% Marcaine with epinephrine was used to anesthetize the skin over the mid-portion of the left inguinal canal. An oblique incision was made. Dissection was carried down through the subcutaneous tissue with cautery to the external oblique fascia.  We opened the external oblique fascia along the direction of its fibers to the external ring.  The spermatic cord was circumferentially dissected bluntly and retracted with a Penrose drain.  The ilioinguinal nerve was identified and preserved.  The floor of the inguinal canal  was inspected and showed a medium-sized direct hernia defect.  The floor of the canal was closed with 0 Vicryl.  We skeletonized the spermatic cord and there was no indirect hernia.  We used a left Progrip mesh which was inserted and deployed across the floor of the inguinal canal. The mesh was tucked underneath the external oblique fascia laterally.  The flap of the mesh was closed around the spermatic cord to recreate the internal inguinal ring.  The mesh was secured to the pubic tubercle with 0 Vicryl.  The external oblique fascia was reapproximated with 2-0 Vicryl.  3-0 Vicryl was used to close the subcutaneous tissues and 4-0 Monocryl was used to close the skin in subcuticular fashion.   We turned our attention to the right side.  0.25 % Marcaine with epinephrine was used to anesthetize the skin over the mid-portion of the right inguinal canal. An oblique incision was made. Dissection was carried down through the subcutaneous tissue with cautery to the external oblique fascia.  We opened the external oblique fascia along the direction of its fibers to the external ring.  The spermatic cord was circumferentially dissected bluntly and retracted with a Penrose drain.  The ilioinguinal nerve was identified and preserved.  The floor of the inguinal canal was inspected and showed a small direct hernia defect.  The floor of the canal was closed with 0 Vicryl  We skeletonized the spermatic cord and there was no indirect hernia.  We used a right Progrip mesh which was inserted and deployed across the floor of the inguinal canal. The mesh was tucked underneath the external oblique fascia laterally.  The flap of the mesh was closed around the spermatic cord to recreate the internal inguinal ring.  The mesh was secured to  the pubic tubercle with 0 Vicryl.  The external oblique fascia was reapproximated with 2-0 Vicryl.  3-0 Vicryl was used to close the subcutaneous tissues and 4-0 Monocryl was used to close the skin in  subcuticular fashion.    Benzoin and steri-strips were used to seal the incisions.  Clean dressings were applied.  The patient was then extubated and brought to the recovery room in stable condition.  All sponge, instrument, and needle counts were correct prior to closure and at the conclusion of the case.   Estimated Blood Loss: Minimal                 Complications: None; patient tolerated the procedure well.         Disposition: PACU - hemodynamically stable.         Condition: stable  Wilmon Arms. Corliss Skains, MD, [facility] Surgery  General/ Trauma Surgery  08/06/2013 8:59 AM

## 2013-08-06 NOTE — Interval H&P Note (Signed)
History and Physical Interval Note:  08/06/2013 6:37 AM  Bill Foley  has presented today for surgery, with the diagnosis of hernia  The various methods of treatment have been discussed with the patient and family. After consideration of risks, benefits and other options for treatment, the patient has consented to  Procedure(s): HERNIA REPAIR INGUINAL ADULT BILATERAL (Bilateral) as a surgical intervention .  The patient's history has been reviewed, patient examined, no change in status, stable for surgery.  I have reviewed the patient's chart and labs.  Questions were answered to the patient's satisfaction.     [name]

## 2013-08-06 NOTE — Anesthesia Preprocedure Evaluation (Signed)
Anesthesia Evaluation  Patient identified by MRN, date of birth, ID band Patient awake    Reviewed: Allergy & Precautions, H&P , NPO status , Patient's Chart, lab work & pertinent test results  Airway Mallampati: II  Neck ROM: full    Dental   Pulmonary shortness of breath, former smoker,          Cardiovascular hypertension, + Peripheral Vascular Disease     Neuro/Psych    GI/Hepatic GERD-  ,  Endo/Other    Renal/GU      Musculoskeletal  (+) Arthritis -,   Abdominal   Peds  Hematology   Anesthesia Other Findings   Reproductive/Obstetrics                           Anesthesia Physical Anesthesia Plan  ASA: II  Anesthesia Plan: General   Post-op Pain Management:    Induction: Intravenous  Airway Management Planned: LMA  Additional Equipment:   Intra-op Plan:   Post-operative Plan:   Informed Consent: I have reviewed the patients History and Physical, chart, labs and discussed the procedure including the risks, benefits and alternatives for the proposed anesthesia with the patient or authorized representative who has indicated his/her understanding and acceptance.     Plan Discussed with: CRNA, Anesthesiologist and Surgeon  Anesthesia Plan Comments:         Anesthesia Quick Evaluation

## 2013-08-06 NOTE — Anesthesia Postprocedure Evaluation (Signed)
Anesthesia Post Note  Patient: Bill Foley  Procedure(s) Performed: Procedure(s) (LRB): HERNIA REPAIR INGUINAL ADULT BILATERAL (Bilateral)  Anesthesia type: General  Patient location: PACU  Post pain: Pain level controlled and Adequate analgesia  Post assessment: Post-op Vital signs reviewed, Patient's Cardiovascular Status Stable, Respiratory Function Stable, Patent Airway and Pain level controlled  Last Vitals:  Filed Vitals:   08/06/13 0945  BP: 162/68  Pulse: 51  Temp:   Resp: 15    Post vital signs: Reviewed and stable  Level of consciousness: awake, alert  and oriented  Complications: No apparent anesthesia complications

## 2013-08-06 NOTE — Anesthesia Procedure Notes (Signed)
Procedure Name: LMA Insertion Performed by: Zia Kanner, [place] Pre-anesthesia Checklist: Patient identified, Emergency Drugs available, Suction available and Patient being monitored Patient Re-evaluated:Patient Re-evaluated prior to inductionOxygen Delivery Method: Circle System Utilized Preoxygenation: Pre-oxygenation with 100% oxygen Intubation Type: IV induction Ventilation: Mask ventilation without difficulty LMA: LMA inserted LMA Size: 5.0 Number of attempts: 1 Airway Equipment and Method: bite block Placement Confirmation: positive ETCO2 Tube secured with: Tape Dental Injury: Teeth and Oropharynx as per pre-operative assessment      

## 2013-08-07 ENCOUNTER — Telehealth (INDEPENDENT_AMBULATORY_CARE_PROVIDER_SITE_OTHER): Payer: Self-pay

## 2013-08-07 ENCOUNTER — Encounter (HOSPITAL_BASED_OUTPATIENT_CLINIC_OR_DEPARTMENT_OTHER): Payer: Self-pay | Admitting: Surgery

## 2013-08-07 DIAGNOSIS — R39198 Other difficulties with micturition: Secondary | ICD-10-CM

## 2013-08-07 MED ORDER — TAMSULOSIN HCL 0.4 MG PO CAPS: 0.4000 mg | ORAL_CAPSULE | Freq: Every day | ORAL | Status: DC

## 2013-08-07 NOTE — Telephone Encounter (Signed)
The pt called one day s/p bilateral inguinal hernia.  He is having voiding difficulties.  He can sit on the toilet for about 20 minutes and just dribble.  Last night he had the urg about every 30 minutes and only dribbled still.  He did not void before he left the hospital.  He is uncomfortable.  He has some swelling.  He bled through his gauze yesterday but it has stopped and is dry now.  I asked Dr Corliss Skains what he would advise.  He said I can order FLomax 0.4 mg po qd # 7.  I notified the pt.  He states he has taken it before with his statin drug and it made him crazy but he will stop the statin for now to take it.  He says this is more important right now.  I sent the order in to his [facility].  I told him to notify us if no improvement.

## 2013-08-11 ENCOUNTER — Ambulatory Visit (INDEPENDENT_AMBULATORY_CARE_PROVIDER_SITE_OTHER): Payer: Medicare Other | Admitting: *Deleted

## 2013-08-11 DIAGNOSIS — R39198 Other difficulties with micturition: Secondary | ICD-10-CM

## 2013-08-11 NOTE — Progress Notes (Signed)
Patient presents today for placement of a indwelling foley catheter.  Patient called this morning with lower abdominal pain, unable to empty bladder, and having to strain to urinate at all.  Patient had a bilateral inguinal hernia repair on 08/06/13.  Dr. Magnus Ivan in office gave verbal order to place foley.  Patient was brought into private room OR room 10 in the office.  Patient was placed laying down on the bed.  Sterile technique was used throughout the procedure.  A 7F catheter was inserted into the urethra.  No resistance was meet that would cause any trauma or irritation.  Once urine was seen then foley was inserted 1 inch further and bulb was inflated.  Clear, amber urine began draining.  No blood or clots were seen in the urine.  Once had drained the tubing was clamped for approximately 5-10 minutes to avoid bladder spasms.  Following this I continued to drain the bladder and another were drained.  The total amount of urine drained was .  The catheter was secured against the patient's leg.  Patient was instructed on how to change from regular bag to leg bag and back.  Patient was also instructed to make sure the tubing and bag were always below the level of the bladder.  All of the patient's questions were answered.  Patient states understanding and agreeable with foley to stay in until next Monday 08/17/13.  Appt scheduled for patient to come in at 10a on 08/17/13 to have foley removed.  Patient states understanding that if he has any questions or issues to call the office during and after business hours.  Patient states understanding of this.

## 2013-08-11 NOTE — Telephone Encounter (Signed)
Patient called in this morning reporting that he is still unable to urinate.  Patient reports that he is going to the bathroom every 15 minutes but only able to dribble and unable to fully empty his bladder.  Patient reports that he has been straining so hard that he has developed a hem and having bleeding/pain at incision sites.  Patient also reports lower abdominal pain.  Patient states he feels miserable.  Due to Dr. Corliss Skains being unavailable, I spoke to Dr. Magnus Ivan here in office who agreeable with placing a foley in the patient.  I called the patient back to update him.  Patient is going to get dressed and come to office for foley placement.  Patient states understanding and agreeable with the plan at this time.

## 2013-08-17 ENCOUNTER — Ambulatory Visit (INDEPENDENT_AMBULATORY_CARE_PROVIDER_SITE_OTHER): Payer: Medicare Other

## 2013-08-17 DIAGNOSIS — Z466 Encounter for fitting and adjustment of urinary device: Secondary | ICD-10-CM

## 2013-08-17 NOTE — Progress Notes (Signed)
Pt comes in today for removal of foley cath which was placed on Friday.  I sought Hunt[place] Dec, RN's assistance since I had never removed a cath.  I removed the leg bag.  I then removed about 9.5 cc's of saline from the balloon.  I pulled the cath out.  Pt tolerated very well.  Pt instructed to contact our office if no output in the next 4-6 hours.  Pt verbalized understanding,  Pt scheduled to come back and see Dr. Corliss Skains on Friday, Dec 5.

## 2013-08-21 ENCOUNTER — Ambulatory Visit (INDEPENDENT_AMBULATORY_CARE_PROVIDER_SITE_OTHER): Payer: Medicare Other | Admitting: Surgery

## 2013-08-21 ENCOUNTER — Encounter (INDEPENDENT_AMBULATORY_CARE_PROVIDER_SITE_OTHER): Payer: Self-pay | Admitting: Surgery

## 2013-08-21 VITALS — BP 192/84 | HR 76 | Temp 97.6°F | Resp 14 | Ht 72.0 in | Wt 187.6 lb

## 2013-08-21 DIAGNOSIS — K4020 Bilateral inguinal hernia, without obstruction or gangrene, not specified as recurrent: Secondary | ICD-10-CM

## 2013-08-21 MED ORDER — BETHANECHOL CHLORIDE 10 MG PO TABS: 10.0000 mg | ORAL_TABLET | Freq: Three times a day (TID) | ORAL | Status: DC

## 2013-08-21 NOTE — Progress Notes (Signed)
S/p open repair of bilateral inguinal hernias on 08/06/13.  He is doing well as far as his hernias are concerned.  Minimal pain and swelling.  No wound issues.  He had a lot of issues with urinary retention and difficulty with urination.  He required a Foley catheter for 5 days and continues to require straining to empty his bladder.  He states that it is improving, but is still difficult to void.  He has been on Flomax before, but it didn't seem to help much this time.  Filed Vitals:   08/21/13 0853  BP: 192/84  Pulse: 76  Temp: 97.6 F (36.4 C)  Resp: 14   His incisions are well-healed with no sign of infection.  No sign of recurrent hernia on either side.  We will try a short course of urecholine to help with his bladder function.  He should limit his level of activity for at least 3 more weeks.  He will call us to let us know if he continues to have urinary symptoms.  He may require Urology consultation if his symptoms persist.  Bill Foley. Corliss Skains, MD, [facility] Surgery  General/ Trauma Surgery  08/21/2013 5:04 PM

## 2013-10-16 DIAGNOSIS — L989 Disorder of the skin and subcutaneous tissue, unspecified: Secondary | ICD-10-CM | POA: Insufficient documentation

## 2013-10-16 DIAGNOSIS — Z87891 Personal history of nicotine dependence: Secondary | ICD-10-CM | POA: Insufficient documentation

## 2013-10-16 DIAGNOSIS — Z0000 Encounter for general adult medical examination without abnormal findings: Secondary | ICD-10-CM | POA: Insufficient documentation

## 2013-10-16 DIAGNOSIS — Z125 Encounter for screening for malignant neoplasm of prostate: Secondary | ICD-10-CM | POA: Insufficient documentation

## 2013-10-16 DIAGNOSIS — Z6825 Body mass index (BMI) 25.0-25.9, adult: Secondary | ICD-10-CM | POA: Insufficient documentation

## 2014-04-30 DIAGNOSIS — M1712 Unilateral primary osteoarthritis, left knee: Secondary | ICD-10-CM | POA: Insufficient documentation

## 2014-04-30 DIAGNOSIS — K439 Ventral hernia without obstruction or gangrene: Secondary | ICD-10-CM | POA: Insufficient documentation

## 2014-06-30 ENCOUNTER — Telehealth: Payer: Self-pay | Admitting: Cardiovascular Disease

## 2014-06-30 NOTE — Telephone Encounter (Signed)
Close encounter 

## 2014-08-23 ENCOUNTER — Encounter: Payer: Self-pay | Admitting: Cardiovascular Disease

## 2014-08-23 ENCOUNTER — Ambulatory Visit (INDEPENDENT_AMBULATORY_CARE_PROVIDER_SITE_OTHER): Payer: Medicare HMO | Admitting: Cardiovascular Disease

## 2014-08-23 VITALS — BP 152/70 | HR 71 | Resp 16 | Ht 72.0 in | Wt 184.5 lb

## 2014-08-23 DIAGNOSIS — R9439 Abnormal result of other cardiovascular function study: Secondary | ICD-10-CM

## 2014-08-23 DIAGNOSIS — I719 Aortic aneurysm of unspecified site, without rupture: Secondary | ICD-10-CM

## 2014-08-23 DIAGNOSIS — E785 Hyperlipidemia, unspecified: Secondary | ICD-10-CM

## 2014-08-23 DIAGNOSIS — I10 Essential (primary) hypertension: Secondary | ICD-10-CM

## 2014-08-23 MED ORDER — AMLODIPINE BESYLATE 10 MG PO TABS: 10.0000 mg | ORAL_TABLET | Freq: Every day | ORAL | Status: DC

## 2014-08-23 NOTE — Progress Notes (Signed)
Patient ID: Bill Foley, male   DOB: 02/25/1931, 80 y.o.   MRN: [id number]     Reason for office visit HTN, aortic aneurysm, hyperlipidemia  Bill Foley returns in followup. He has long-standing hypertension and a mildly dilated ascending aorta, moderate concentric LVH, normal EF greater than 55%, impaired LV relaxation pattern by mitral inflow, moderate to severely dilated left atrium, mild to moderately dilated right atrium, aneurysmal atrial septum, aortic valve sclerosis without stenosis. He had a palmar stress test in September 2014 with excellent exercise tolerance but with rather confusing metabolic data (excellent effort, normal peak VO2, normal anaerobic threshold, stroke volume plateau at peak exercise following anaerobic threshold; consider ischemic response).  He feels great. He has been riding his motorcycle for 20 thousand miles this year and has no complaints. He has been carefully monitoring his blood pressure and heart rate at home. The range of blood pressures has been 110/50s-160s/70s. Denies fatigue, syncope, angina or exertional dyspnea. He does have occasional lightheadedness.  He has been taking pravastatin in the morning as he hoped this would prevent lightheadedness. I think he confused instructions may be with the timing of losartan.  No Known Allergies  Current Outpatient Prescriptions  Medication Sig Dispense Refill  . aspirin 81 MG tablet Take 81 mg by mouth 2 (two) times a week.     Marland Kitchen ibuprofen (ADVIL,MOTRIN) 200 MG tablet Take 200 mg by mouth every 6 (six) hours as needed for pain.    Javier Docker Oil 300 MG CAPS Take 300 mg by mouth every Monday, Wednesday, and Friday.     . losartan (COZAAR) 100 MG tablet Take 100 mg by mouth every morning.     Marland Kitchen OVER THE COUNTER MEDICATION Omega Red Takes M/W/F    . pravastatin (PRAVACHOL) 40 MG tablet Take 40 mg by mouth every evening.     Marland Kitchen amLODipine (NORVASC) 10 MG tablet Take 1 tablet (10 mg total) by mouth daily. 90 tablet  3   No current facility-administered medications for this visit.    Past Medical History  Diagnosis Date  . Basal cell carcinoma   . Hyperlipidemia   . Tinnitus   . Hypertension   . Aortic aneurysm   . Aortic aneurysm     "JUST UNDER 2 CM"  . Shortness of breath     WITH EXERTION  . GERD (gastroesophageal reflux disease)     OCCASIONAL  . Gallstones   . Weak urinary stream   . Arthritis     RT WRIST  . Slow heart rate     "has been down to 45 in the past"    Past Surgical History  Procedure Laterality Date  . Tonsillectomy    . Vasectomy    . Total knee arthroplasty  2007    RT  . Ankle surgery Left 1975  . Cholecystectomy N/A 04/08/2013    Procedure: LAPAROSCOPIC CHOLECYSTECTOMY WITH INTRAOPERATIVE CHOLANGIOGRAM;  Surgeon: Imogene Burn. Georgette Dover, MD;  Location: [facility];  Service: General;  Laterality: N/A;  . Laparoscopic appendectomy N/A 04/08/2013    Procedure: LAPAROSCOPIC PARTIAL CECECTOMY;  Surgeon: Imogene Burn. Georgette Dover, MD;  Location: [facility];  Service: General;  Laterality: N/A;  . Colonoscopy    . Inguinal hernia repair Bilateral 08/06/2013    Procedure: HERNIA REPAIR INGUINAL ADULT BILATERAL;  Surgeon: Imogene Burn. Georgette Dover, MD;  Location: [place];  Service: General;  Laterality: Bilateral;    No family history on file.  History   Social History  .  Marital Status: Married    Spouse Name: N/A    Number of Children: N/A  . Years of Education: N/A   Occupational History  . Not on file.   Social History Main Topics  . Smoking status: Former Smoker    Quit date: 03/11/1963  . Smokeless tobacco: Not on file  . Alcohol Use: 0.0 oz/week    5-7 Glasses of wine per week  . Drug Use: No  . Sexual Activity: Not on file   Other Topics Concern  . Not on file   Social History Narrative    Review of systems: The patient specifically denies any chest pain at rest or with exertion, dyspnea at rest or with exertion, orthopnea, paroxysmal nocturnal dyspnea,  syncope, palpitations, focal neurological deficits, intermittent claudication, lower extremity edema, unexplained weight gain, cough, hemoptysis or wheezing.  The patient also denies abdominal pain, nausea, vomiting, dysphagia, diarrhea, constipation, polyuria, polydipsia, dysuria, hematuria, frequency, urgency, abnormal bleeding or bruising, fever, chills, unexpected weight changes, mood swings, change in skin or hair texture, change in voice quality, auditory or visual problems, allergic reactions or rashes, new musculoskeletal complaints other than usual "aches and pains".   PHYSICAL EXAM BP 152/70 mmHg  Pulse 71  Resp 16  Ht 6' (1.829 m)  Wt 184 lb 8 oz (83.689 kg)  BMI 25.02 kg/m2  General: Alert, oriented x3, no distress Head: no evidence of trauma, PERRL, EOMI, no exophtalmos or lid lag, no myxedema, no xanthelasma; normal ears, nose and oropharynx Neck: normal jugular venous pulsations and no hepatojugular reflux; brisk carotid pulses without delay and no carotid bruits Chest: clear to auscultation, no signs of consolidation by percussion or palpation, normal fremitus, symmetrical and full respiratory excursions Cardiovascular: normal position and quality of the apical impulse, regular rhythm, normal first and widely split second heart sounds, no rubs or gallops, early peaking grade 2/6 systolic ejection murmur in the aortic focus Abdomen: no tenderness or distention, no masses by palpation, no abnormal pulsatility or arterial bruits, normal bowel sounds, no hepatosplenomegaly Extremities: no clubbing, cyanosis or edema; 2+ radial, ulnar and brachial pulses bilaterally; 2+ right femoral, posterior tibial and dorsalis pedis pulses; 2+ left femoral, posterior tibial and dorsalis pedis pulses; no subclavian or femoral bruits Neurological: grossly nonfocal   EKG: Sinus rhythm, short PR interval of only 80 ms right bundle branch block  Lipid Panel  No results found for: CHOL, TRIG, HDL,  CHOLHDL, VLDL, LDLCALC, LDLDIRECT  BMET    Component Value Date/Time   NA 142 08/03/2013 1200   K 4.3 08/03/2013 1200   CL 107 08/03/2013 1200   CO2 24 08/03/2013 1200   GLUCOSE 111* 08/03/2013 1200   BUN 16 08/03/2013 1200   CREATININE 1.03 08/03/2013 1200   CALCIUM 9.0 08/03/2013 1200   GFRNONAA 66* 08/03/2013 1200   GFRAA 77* 08/03/2013 1200     ASSESSMENT AND PLAN  Aortic aneurysm of unspecified site without mention of rupture Per his echocardiogram performed [facility] he has a mildly dilated ascending aorta, which is hard to really qualify as being an aneurysm considering the reported aortic diameter.   Hyperlipidemia On statin therapy. We'll need to check his lipid profile at his next appointment.  HTN (hypertension) Satisfactory control, occasionally elevated. I wonder whether the erratic systolic blood pressure recordings and lightheadedness might be related to the short acting twice daily hydralazine. Will try to replace this with amlodipine once daily. He is instructed to take the valsartan and amlodipine at separate times  of the day. If this change does not agree with him, the medications can be changed back at his upcoming appointment with Dr. Coletta Memos.  As long as he remains asymptomatic, aggressive coronary evaluation is not necessary, not withstanding his stress test abnormalities. Would focus on risk factor modification with good treatment of hyperlipidemia and hypertension. He is physically fit, active and lean.  Orders Placed This Encounter  Procedures  . EKG 12-Lead   Meds ordered this encounter  Medications  . amLODipine (NORVASC) 10 MG tablet    Sig: Take 1 tablet (10 mg total) by mouth daily.    Dispense:  90 tablet    Refill:  Tresckow Nahiara Kretzschmar, MD, [facility] [phone] office [phone] pager

## 2014-08-23 NOTE — Patient Instructions (Signed)
STOP Hydralazine.  START Amlodipine 10mg  daily.  Take this at a different time of day from the Losartan.  Take the Pravastatin in the evening.  Dr. Sallyanne Kuster recommends that you schedule a follow-up appointment in: ONE YEAR.

## 2014-11-23 ENCOUNTER — Other Ambulatory Visit: Payer: Self-pay | Admitting: Gastroenterology

## 2015-08-29 ENCOUNTER — Ambulatory Visit: Payer: Medicare HMO | Admitting: Cardiovascular Disease

## 2015-08-31 ENCOUNTER — Ambulatory Visit (INDEPENDENT_AMBULATORY_CARE_PROVIDER_SITE_OTHER): Payer: Medicare HMO | Admitting: Cardiovascular Disease

## 2015-08-31 ENCOUNTER — Encounter: Payer: Self-pay | Admitting: Cardiovascular Disease

## 2015-08-31 VITALS — BP 134/70 | HR 79 | Ht 72.0 in | Wt 190.5 lb

## 2015-08-31 DIAGNOSIS — I493 Ventricular premature depolarization: Secondary | ICD-10-CM

## 2015-08-31 DIAGNOSIS — I452 Bifascicular block: Secondary | ICD-10-CM

## 2015-08-31 DIAGNOSIS — I10 Essential (primary) hypertension: Secondary | ICD-10-CM | POA: Diagnosis not present

## 2015-08-31 DIAGNOSIS — I714 Abdominal aortic aneurysm, without rupture, unspecified: Secondary | ICD-10-CM

## 2015-08-31 DIAGNOSIS — I498 Other specified cardiac arrhythmias: Secondary | ICD-10-CM

## 2015-08-31 DIAGNOSIS — E785 Hyperlipidemia, unspecified: Secondary | ICD-10-CM

## 2015-08-31 DIAGNOSIS — I712 Thoracic aortic aneurysm, without rupture, unspecified: Secondary | ICD-10-CM

## 2015-08-31 NOTE — Progress Notes (Signed)
Cardiology Office Note   Date:  08/31/2015   ID:  Bill Foley, DOB Apr 11, 1931, MRN [id number]  PCP:  Bill Inches, MD  Cardiologist:   Bill Casino, MD   Chief Complaint  Patient presents with  . Annual Exam    Occas. SOB with activity.  No complaints of chest pain or dizziness.  Occas. mild lightheadedness.      History of Present Illness: Bill Foley is a 81 y.o. male who presents for  HTN, aortic aneurysm, hyperlipidemia  Bill Foley returns in followup. He has long-standing hypertension and a mildly dilated ascending aorta, moderate concentric LVH, normal EF greater than 55%, impaired LV relaxation pattern by mitral inflow, moderate to severely dilated left atrium, mild to moderately dilated right atrium, aneurysmal atrial septum, aortic valve sclerosis without stenosis. He had a cardiopulmonary stress test in September 2014 with excellent exercise tolerance but with rather confusing metabolic data (excellent effort, normal peak VO2, normal anaerobic threshold, stroke volume plateau at peak exercise following anaerobic threshold; consider ischemic response). Overall he is doing well. He can hear his heartbeat "miss" at times when he lays down in the pillow. He denies any chest pain or worsening shortness of breath or leg edema.  Abdominal CT showed a relatively small infrarenal aneurysm in 2012.   Describes a slight and slow pattern of worsening exertional dyspnea that is not interfering with usual activity and is promptly relieved by rest. Atypical chest pain is clearly musculoskeletal. He is rarely aware of his PVCs: only when he lies on his left side at night. He continues right his motorcycle and does not feel he is in [place] by any physical difficulties.   Past Medical History  Diagnosis Date  . Basal cell carcinoma   . Hyperlipidemia   . Tinnitus   . Hypertension   . Aortic aneurysm ([place])   . Aortic aneurysm ([facility])     "JUST UNDER 2 CM"  . Shortness of  breath     WITH EXERTION  . GERD (gastroesophageal reflux disease)     OCCASIONAL  . Gallstones   . Weak urinary stream   . Arthritis     RT WRIST  . Slow heart rate     "has been down to 45 in the past"    Past Surgical History  Procedure Laterality Date  . Tonsillectomy    . Vasectomy    . Total knee arthroplasty  2007    RT  . Ankle surgery Left 1975  . Cholecystectomy N/A 04/08/2013    Procedure: LAPAROSCOPIC CHOLECYSTECTOMY WITH INTRAOPERATIVE CHOLANGIOGRAM;  Surgeon: Bill Foley. Bill Dover, MD;  Location: [facility];  Service: General;  Laterality: N/A;  . Laparoscopic appendectomy N/A 04/08/2013    Procedure: LAPAROSCOPIC PARTIAL CECECTOMY;  Surgeon: Bill Foley. Bill Dover, MD;  Location: [facility];  Service: General;  Laterality: N/A;  . Colonoscopy    . Inguinal hernia repair Bilateral 08/06/2013    Procedure: HERNIA REPAIR INGUINAL ADULT BILATERAL;  Surgeon: Bill Foley. Bill Dover, MD;  Location: [place];  Service: General;  Laterality: Bilateral;    Current Outpatient Prescriptions  Medication Sig Dispense Refill  . amLODipine (NORVASC) 10 MG tablet Take 1 tablet (10 mg total) by mouth daily. 90 tablet 3  . aspirin 81 MG tablet Take 81 mg by mouth 2 (two) times a week.     Marland Kitchen ibuprofen (ADVIL,MOTRIN) 200 MG tablet Take 200 mg by mouth every 6 (six) hours as needed for pain.    Marland Kitchen  Krill Oil 300 MG CAPS Take 300 mg by mouth every Monday, Wednesday, and Friday.     . losartan (COZAAR) 100 MG tablet Take 100 mg by mouth every morning.     Marland Kitchen OVER THE COUNTER MEDICATION Omega Red Takes M/W/F    . pravastatin (PRAVACHOL) 40 MG tablet Take 40 mg by mouth every evening.      No current facility-administered medications for this visit.    Allergies:   Review of patient's allergies indicates no known allergies.   Social History:  The patient  reports that he quit smoking about 52 years ago. He does not have any smokeless tobacco history on file. He reports that he drinks alcohol. He  reports that he does not use illicit drugs.   Family History:  The patient's family history includes Alcohol abuse in his father; Cancer in his mother.   ROS:   Please see the history of present illness.    ROS All other systems reviewed and are negative.   PHYSICAL EXAM: VS:  BP 134/70 mmHg  Pulse 79  Ht 6' (1.829 m)  Wt 190 lb 8 oz (86.41 kg)  BMI 25.83 kg/m2 , Body mass index is 25.83 kg/(m^2). GEN: Well nourished, well developed, in no acute distress HEENT: normal Neck: no JVD, occasional cannon A waves, carotid bruits, or masses Cardiac: Reguarlarly irregular; no murmurs, rubs, or gallops,no edema, bounding equal pulses in all extremities Respiratory:  clear to auscultation bilaterally, normal work of breathing GI: soft, nontender, nondistended, + BS, small easily reducible 2 cm incisional hernia MS: no deformity or atrophy Skin: warm and dry, no rash Neuro:  Alert and Oriented x 3, Strength and sensation are intact Psych: euthymic mood, full affect   EKG:  EKG is ordered today.  The ekg ordered today demonstrates   Accelerated junctional rhythm, right bundle branch block, left anterior fascicular block, PVCs in a pattern of trigeminy   Recent Labs: No results found for requested labs within last 365 days.    Lipid Panel No results found for: CHOL, TRIG, HDL, CHOLHDL, VLDL, LDLCALC, LDLDIRECT    Wt Readings from Last 3 Encounters:  08/31/15 190 lb 8 oz (86.41 kg)  08/23/14 184 lb 8 oz (83.689 kg)  08/21/13 187 lb 9.6 oz (85.095 kg)      Other studies Reviewed: Additional studies/ records that were reviewed today include:  CT abdomen and pelvis from outside hospital in 2012 Echocardiogram from outside hospital in 2011  Review of the above records demonstrates:  3.0 cm infrarenal abdominal aortic aneurysm Moderate to severe LAE, moderate LVH, normal LV function  ASSESSMENT AND PLAN:  1. Essential hypertension  -  Now well controlled , no longer with  symptoms of orthostatic hypotension as in the past. Minimal intermittent ankle edema.  2. Hyperlipidemia  -  Time to reevaluate  3. Thoracic aortic aneurysm without rupture (HCC)  -  Uncertain diagnosis, mentioned on previous echo report, but actual measurement of the aortic root only 35 mm  4. Abdominal aortic aneurysm (AAA) 30 to 34 mm in diameter (HCC)   -  No longer a smoker, has not been reevaluated in about 4 years. Will order duplex ultrasound  5.        Frequent premature ventricular complexes, essentially asymptomatic. No  treatment 6.   Accelerated junctional rhythm and bifascicular block (right bundle branch block and left anterior fascicular block), avoid beta blockers and other AV nodal blocking agents. No need for pacemaker therapy as long  as he is asymptomatic.     Medication Adjustments/Labs and Tests Ordered: Current medicines are reviewed at length with the patient today.  Concerns regarding medicines are outlined above.  Labs/Tests ordered today are listed below. Patient Instructions    Your physician recommends that you schedule a follow-up appointment in:        Signed, Bill Casino, MD  08/31/2015 2:28 PM    [place]

## 2015-08-31 NOTE — Patient Instructions (Addendum)
Your physician has requested that you have an abdominal aorta duplex. During this test, an ultrasound is used to evaluate the aorta. Allow 30 minutes for this exam. Do not eat after midnight the day before and avoid carbonated beverages  Your physician recommends that you return for lab work in: Fasting (nothing to eat after midnight - at least 6 hours before)  Your physician recommends that you schedule a follow-up appointment in: 1 year with Dr. Sallyanne Kuster

## 2015-09-05 ENCOUNTER — Ambulatory Visit: Payer: Medicare HMO | Admitting: Cardiovascular Disease

## 2015-09-05 ENCOUNTER — Telehealth: Payer: Self-pay | Admitting: Cardiovascular Disease

## 2015-09-05 NOTE — Telephone Encounter (Signed)
° °*  STAT* If patient is at the pharmacy, call can be transferred to refill team.   1. Which medications need to be refilled? (please list name of each medication and dose if known) Amlodipine 10 mg  2. Which pharmacy/location (including street and city if local pharmacy) is medication to be sent to? [facility] on [address]  3. Do they need a 30 day or 90 day supply? 90 days

## 2015-09-06 MED ORDER — AMLODIPINE BESYLATE 10 MG PO TABS: 10.0000 mg | ORAL_TABLET | Freq: Every day | ORAL | Status: DC

## 2015-09-06 NOTE — Telephone Encounter (Signed)
RX was send into pt pharmacy 

## 2015-09-16 ENCOUNTER — Inpatient Hospital Stay (HOSPITAL_COMMUNITY): Admission: RE | Admit: 2015-09-16 | Payer: Medicare HMO | Source: Ambulatory Visit

## 2015-11-07 ENCOUNTER — Other Ambulatory Visit: Payer: Self-pay | Admitting: Cardiovascular Disease

## 2015-11-07 NOTE — Telephone Encounter (Signed)
Rx(s) sent to pharmacy electronically.  

## 2016-01-12 ENCOUNTER — Ambulatory Visit (HOSPITAL_COMMUNITY)
Admission: RE | Admit: 2016-01-12 | Discharge: 2016-01-12 | Disposition: A | Discharge status: Home / Self Care | Payer: Medicare HMO | Source: Ambulatory Visit | Attending: Cardiology | Admitting: Cardiology

## 2016-01-12 DIAGNOSIS — K219 Gastro-esophageal reflux disease without esophagitis: Secondary | ICD-10-CM | POA: Insufficient documentation

## 2016-01-12 DIAGNOSIS — I10 Essential (primary) hypertension: Secondary | ICD-10-CM | POA: Insufficient documentation

## 2016-01-12 DIAGNOSIS — E785 Hyperlipidemia, unspecified: Secondary | ICD-10-CM | POA: Insufficient documentation

## 2016-01-12 DIAGNOSIS — I708 Atherosclerosis of other arteries: Secondary | ICD-10-CM | POA: Diagnosis not present

## 2016-01-12 DIAGNOSIS — I714 Abdominal aortic aneurysm, without rupture, unspecified: Secondary | ICD-10-CM

## 2016-01-12 DIAGNOSIS — I700 Atherosclerosis of aorta: Secondary | ICD-10-CM | POA: Insufficient documentation

## 2016-01-13 ENCOUNTER — Telehealth: Payer: Self-pay

## 2016-01-13 DIAGNOSIS — I714 Abdominal aortic aneurysm, without rupture, unspecified: Secondary | ICD-10-CM

## 2016-01-13 NOTE — Telephone Encounter (Signed)
-----   Message from Sanda Klein, MD sent at 01/12/2016  7:09 PM EDT ----- The aortic aneurysm has grown very slowly since 2012 (when it was 3.0 cm) til now (3.5 cm). This is still small (surgery is recommended when it exceeds 5.5 or 6.0 cm). Reevaluate with [place] in 12 months. Not smoking (good job!) and keeping BP normal are the most important ways to slow the aneurysm's growth

## 2016-03-12 ENCOUNTER — Encounter: Payer: Self-pay | Admitting: Gastroenterology

## 2016-04-03 DIAGNOSIS — R61 Generalized hyperhidrosis: Secondary | ICD-10-CM | POA: Insufficient documentation

## 2016-04-27 DIAGNOSIS — N2889 Other specified disorders of kidney and ureter: Secondary | ICD-10-CM | POA: Insufficient documentation

## 2016-05-02 ENCOUNTER — Other Ambulatory Visit (HOSPITAL_COMMUNITY): Payer: Self-pay | Admitting: Family Medicine

## 2016-05-02 DIAGNOSIS — N2889 Other specified disorders of kidney and ureter: Secondary | ICD-10-CM

## 2016-05-07 ENCOUNTER — Encounter: Payer: Self-pay | Admitting: Hematology & Oncology

## 2016-05-08 ENCOUNTER — Other Ambulatory Visit (HOSPITAL_BASED_OUTPATIENT_CLINIC_OR_DEPARTMENT_OTHER): Payer: Medicare HMO

## 2016-05-08 ENCOUNTER — Ambulatory Visit (HOSPITAL_COMMUNITY)
Admission: RE | Admit: 2016-05-08 | Discharge: 2016-05-08 | Disposition: A | Discharge status: Home / Self Care | Payer: Medicare HMO | Source: Ambulatory Visit | Attending: Family Medicine | Admitting: Family Medicine

## 2016-05-08 ENCOUNTER — Ambulatory Visit (HOSPITAL_BASED_OUTPATIENT_CLINIC_OR_DEPARTMENT_OTHER): Payer: Medicare HMO | Admitting: Hematology & Oncology

## 2016-05-08 ENCOUNTER — Encounter: Payer: Self-pay | Admitting: Hematology & Oncology

## 2016-05-08 ENCOUNTER — Ambulatory Visit: Payer: Medicare HMO

## 2016-05-08 VITALS — BP 111/54 | HR 75 | Temp 98.1°F | Resp 16 | Ht 72.0 in | Wt 170.0 lb

## 2016-05-08 DIAGNOSIS — C7802 Secondary malignant neoplasm of left lung: Secondary | ICD-10-CM | POA: Diagnosis not present

## 2016-05-08 DIAGNOSIS — C7989 Secondary malignant neoplasm of other specified sites: Secondary | ICD-10-CM | POA: Insufficient documentation

## 2016-05-08 DIAGNOSIS — N2889 Other specified disorders of kidney and ureter: Secondary | ICD-10-CM | POA: Diagnosis not present

## 2016-05-08 DIAGNOSIS — C801 Malignant (primary) neoplasm, unspecified: Secondary | ICD-10-CM | POA: Diagnosis not present

## 2016-05-08 DIAGNOSIS — I10 Essential (primary) hypertension: Secondary | ICD-10-CM | POA: Diagnosis not present

## 2016-05-08 DIAGNOSIS — C641 Malignant neoplasm of right kidney, except renal pelvis: Secondary | ICD-10-CM

## 2016-05-08 DIAGNOSIS — K7689 Other specified diseases of liver: Secondary | ICD-10-CM

## 2016-05-08 DIAGNOSIS — C7801 Secondary malignant neoplasm of right lung: Secondary | ICD-10-CM | POA: Insufficient documentation

## 2016-05-08 DIAGNOSIS — R590 Localized enlarged lymph nodes: Secondary | ICD-10-CM | POA: Insufficient documentation

## 2016-05-08 LAB — CBC WITH DIFFERENTIAL (CANCER CENTER ONLY)
BASO#: 0.1 10*3/uL (ref 0.0–0.2)
BASO%: 1 % (ref 0.0–2.0)
EOS ABS: 0.7 10*3/uL — AB (ref 0.0–0.5)
EOS%: 5.7 % (ref 0.0–7.0)
HCT: 34.7 % — ABNORMAL LOW (ref 38.7–49.9)
HGB: 11.1 g/dL — ABNORMAL LOW (ref 13.0–17.1)
LYMPH#: 1.7 10*3/uL (ref 0.9–3.3)
LYMPH%: 14 % (ref 14.0–48.0)
MCH: 25.6 pg — AB (ref 28.0–33.4)
MCHC: 32 g/dL (ref 32.0–35.9)
MCV: 80 fL — ABNORMAL LOW (ref 82–98)
MONO#: 1.6 10*3/uL — ABNORMAL HIGH (ref 0.1–0.9)
MONO%: 12.6 % (ref 0.0–13.0)
NEUT#: 8.2 10*3/uL — ABNORMAL HIGH (ref 1.5–6.5)
NEUT%: 66.7 % (ref 40.0–80.0)
PLATELETS: 390 10*3/uL (ref 145–400)
RBC: 4.34 10*6/uL (ref 4.20–5.70)
RDW: 15 % (ref 11.1–15.7)
WBC: 12.3 10*3/uL — AB (ref 4.0–10.0)

## 2016-05-08 LAB — LACTATE DEHYDROGENASE: LDH: 160 U/L (ref 125–245)

## 2016-05-08 LAB — CMP (CANCER CENTER ONLY)
ALBUMIN: 2.7 g/dL — AB (ref 3.3–5.5)
ALT(SGPT): 56 U/L — ABNORMAL HIGH (ref 10–47)
AST: 35 U/L (ref 11–38)
Alkaline Phosphatase: 169 U/L — ABNORMAL HIGH (ref 26–84)
BUN: 14 mg/dL (ref 7–22)
CHLORIDE: 106 meq/L (ref 98–108)
CO2: 25 meq/L (ref 18–33)
CREATININE: 1.1 mg/dL (ref 0.6–1.2)
Calcium: 8.5 mg/dL (ref 8.0–10.3)
GLUCOSE: 108 mg/dL (ref 73–118)
Potassium: 4.3 mEq/L (ref 3.3–4.7)
SODIUM: 135 meq/L (ref 128–145)
TOTAL PROTEIN: 6.6 g/dL (ref 6.4–8.1)
Total Bilirubin: 1.3 mg/dl (ref 0.20–1.60)

## 2016-05-08 LAB — GLUCOSE, CAPILLARY: GLUCOSE-CAPILLARY: 113 mg/dL — AB (ref 65–99)

## 2016-05-08 IMAGING — PT NM PET TUM IMG INITIAL (PI) SKULL BASE T - THIGH
1 series · 7 of 7 positions shown · non-contrast
Comparison: CT abdomen 04/24/2016

ADDENDUM:
LEFT / RIGHT Correction:
CLINICAL DATA: Initial treatment strategy for renal mass.

EXAM:
NUCLEAR MEDICINE PET SKULL BASE TO THIGH
TECHNIQUE: 8.5 mCi F-18 FDG was injected intravenously. Full-ring PET imaging
was performed from the skull base to thigh after the radiotracer. CT
data was obtained and used for attenuation correction and anatomic
localization.
FASTING BLOOD GLUCOSE:  Value: 113 mg/dl

[Series 1032: results mm oncology reading · 1.03mm/px · 7 of 7 slices shown]
[im 1/7]
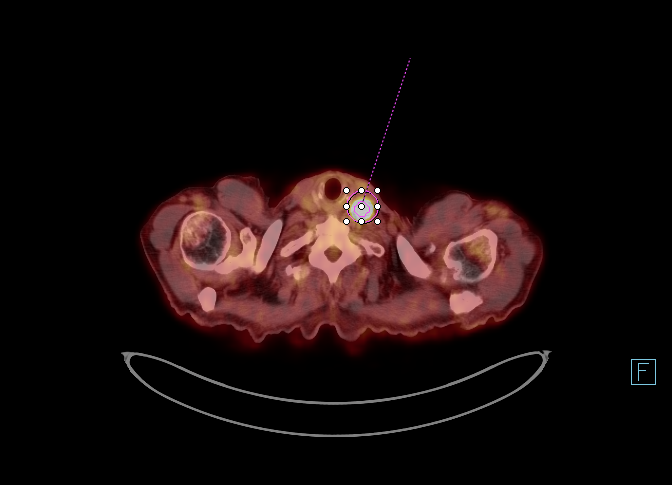
[im 2/7]
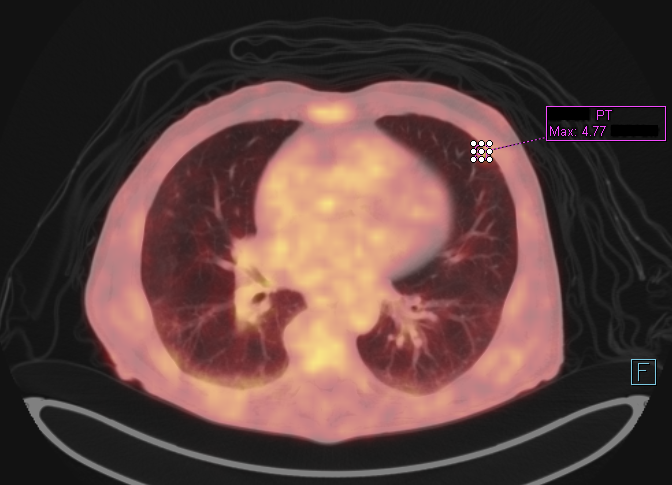
[im 3/7]
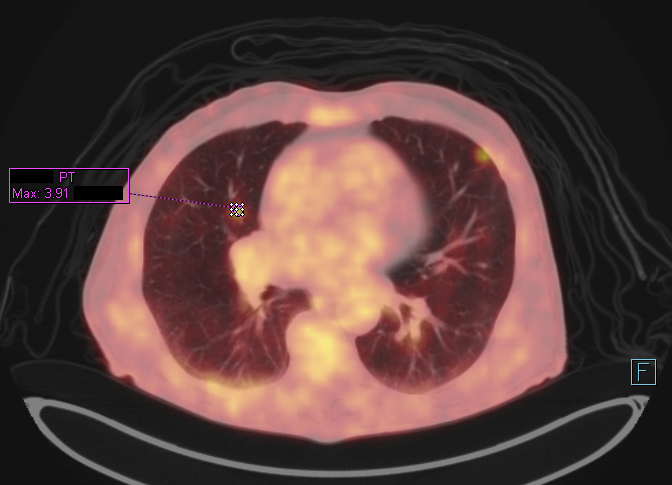
[im 4/7]
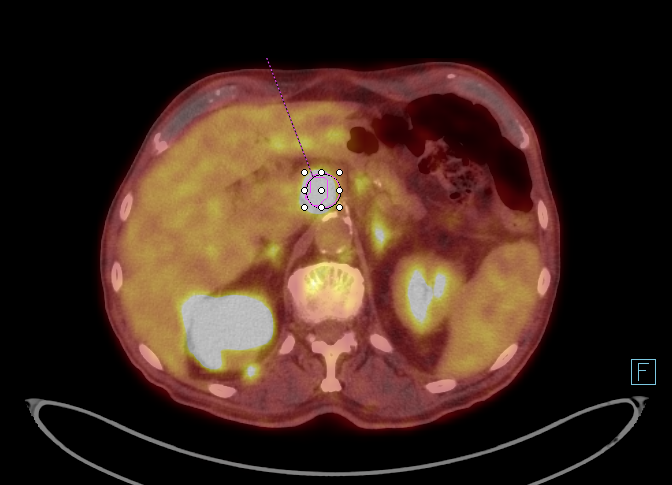
[im 5/7]
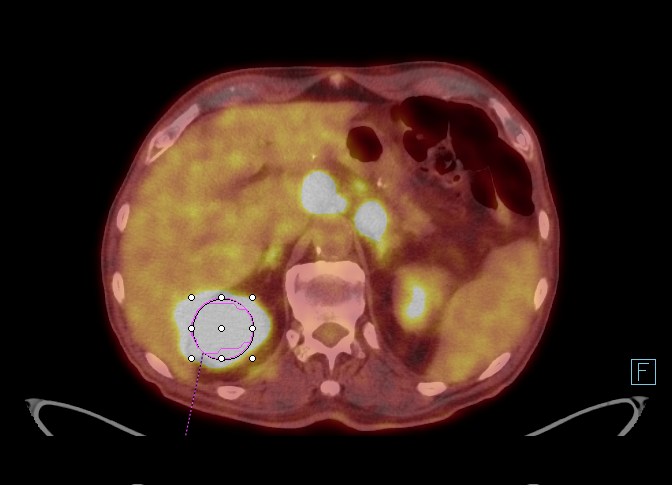
[im 6/7]
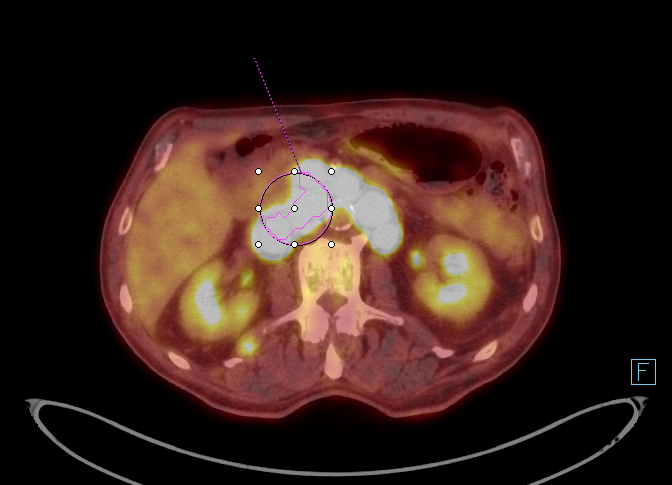
[im 7/7]
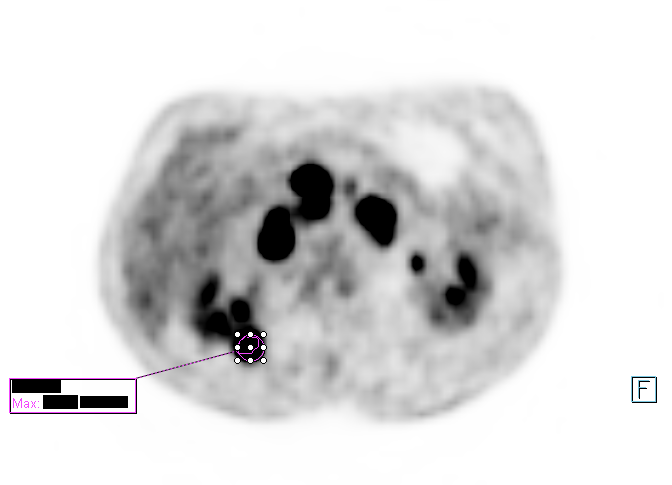

[7 of 7 positions shown; findings below may reference images not displayed]

IMPRESSION: 1. Intensely hypermetabolic mass in the upper pole of the RIGHT

kidney with differential including the renal cell carcinoma versus

Lymphoma.

RIGHT renal mass in the impression now corresponds to RIGHT renal
mass in findings.

Findings conveyed Isidro Escudero nurse on 05/09/2016  at[DATE].
FINDINGS: NECK

Hypermetabolic LEFT supraclavicular lymph node measures 14 mm short
axis with SUV max equal 32.

CHEST

Bilateral small hypermetabolic pulmonary nodules which are intensely
metabolic for size. For example, 6 mm lingular nodule (image 96,
series 4) with SUV max equal 4.8.

5 mm RIGHT middle lobe nodule with SUV max equal 3.9.

ABDOMEN/PELVIS

Cluster of intensely hypermetabolic lymph nodes surrounding the
upper abdominal aorta. Example lymph node between the aorta and IVC
measures 2.3 cm short axis with SUV max equal 33.5.

There is a hypermetabolic mass in the upper pole of the RIGHT kidney
measuring approximate 5.5 cm with SUV max equal

Hypermetabolic nodule within the the paraspinal musculature adjacent
to the RIGHT kidney. This mass is difficult to see on the CT portion
but there is mild thickening to 15 mm image 136, series 4. This
lesions is intensely metabolic also.

SKELETON

No focal hypermetabolic activity to suggest skeletal metastasis.
IMPRESSION: 1. Intensely hypermetabolic mass in the upper pole of the LEFT
kidney with differential including the renal cell carcinoma versus
lymphoma.
2. Intensely hypermetabolic periaortic retroperitoneal bulky
adenopathy.
3. Muscular metastasis within the paraspinal musculature adjacent to
the RIGHT kidney.
4. Bilateral small hypermetabolic pulmonary metastasis. This pattern
ofpulmonary metastasis favors renal cell carcinoma over lymphoma
5. Intensely hypermetabolic LEFT supraclavicular lymph node. THIS
LYMPH NODE WOULD BE AMENABLE TO PERCUTANEOUS BIOPSY.

## 2016-05-08 MED ORDER — FLUDEOXYGLUCOSE F - 18 (FDG) INJECTION
8.5400 | Freq: Once | INTRAVENOUS | Status: AC | PRN
  Administered 2016-05-08: 8.54 via INTRAVENOUS

## 2016-05-08 NOTE — Progress Notes (Signed)
Referral MD  Reason for Referral: Right renal mass   Chief Complaint  Patient presents with  . Other    New Patient  : I have been having night sweats for several months.  HPI: Mr. Bill Foley is a very nice 82 year old white male. He is in very good health. 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 He was in the [employer].  He began to have some night sweats a few months ago. These were somewhat drenching. He would have to change his pajamas.  He is not having any abdominal pain. He is not having any change in bowel or bladder habits. He was not having any cough. There is no bony pain. He had lost about 20 pounds but was trying to lose some weight. His appetite has remained good.  He finally saw his family doctor. Dr. Coletta Memos, being thorough as he always is, did a CT scan. This was done on August 8. The CT scan surprisingly showed a 5.5 x 4.8 x 5.2 cm mass in the upper pole of the right kidney. The right renal vein was patent. He has some retroperitoneal adenopathy. The largest left measured 3.6 x 2.7 cm. He has some liver cysts. The left kidney was normal.  Upon finding this out, Mr. Gavin Potters was kindly referred to the [facility] for an evaluation.  He is due for a PET scan today.  He does not smoke. He probably smoked for a few years when but stopped back in 1964. He has rare alcohol drink.  There is a family history of cancer. His mother died of throat cancer but she drank and smoked.  Overall, he has been in great health. His performance status is ECOG 0.   Past Medical History:  Diagnosis Date  . Aortic aneurysm ([place])   . Aortic aneurysm ([facility])    "JUST UNDER 2 CM"  . Arthritis    RT WRIST  . Basal cell carcinoma   . Gallstones   . GERD (gastroesophageal reflux disease)    OCCASIONAL  . Hyperlipidemia   . Hypertension   . Shortness of breath    WITH EXERTION  . Slow heart rate    "has been down to 45 in the past"  . Tinnitus   . Weak urinary  stream   :  Past Surgical History:  Procedure Laterality Date  . ANKLE SURGERY Left 1975  . CHOLECYSTECTOMY N/A 04/08/2013   Procedure: LAPAROSCOPIC CHOLECYSTECTOMY WITH INTRAOPERATIVE CHOLANGIOGRAM;  Surgeon: Imogene Burn. Georgette Dover, MD;  Location: [facility];  Service: General;  Laterality: N/A;  . COLONOSCOPY    . INGUINAL HERNIA REPAIR Bilateral 08/06/2013   Procedure: HERNIA REPAIR INGUINAL ADULT BILATERAL;  Surgeon: Imogene Burn. Georgette Dover, MD;  Location: [place];  Service: General;  Laterality: Bilateral;  . LAPAROSCOPIC APPENDECTOMY N/A 04/08/2013   Procedure: LAPAROSCOPIC PARTIAL CECECTOMY;  Surgeon: Imogene Burn. Georgette Dover, MD;  Location: [facility];  Service: General;  Laterality: N/A;  . TONSILLECTOMY    . TOTAL KNEE ARTHROPLASTY  2007   RT  . VASECTOMY    :   Current Outpatient Prescriptions:  .  amLODipine (NORVASC) 10 MG tablet, Take 1 tablet (10 mg total) by mouth daily., Disp: 90 tablet, Rfl: 3 .  aspirin 81 MG tablet, Take 81 mg by mouth 2 (two) times a week. , Disp: , Rfl:  .  ibuprofen (ADVIL,MOTRIN) 200 MG tablet, Take 200 mg by mouth every 6 (six) hours as needed for pain., Disp: ,  Rfl:  .  Krill Oil 300 MG CAPS, Take 300 mg by mouth every Monday, Wednesday, and Friday. , Disp: , Rfl:  .  losartan (COZAAR) 100 MG tablet, Take 100 mg by mouth every morning. , Disp: , Rfl:  .  OVER THE COUNTER MEDICATION, Omega Red Takes M/W/F, Disp: , Rfl:  .  pravastatin (PRAVACHOL) 40 MG tablet, Take 40 mg by mouth every evening. , Disp: , Rfl: :  :  No Known Allergies:  Family History  Problem Relation Age of Onset  . Cancer Mother   . Alcohol abuse Father   :  Social History   Social History  . Marital status: Married    Spouse name: N/A  . Number of children: N/A  . Years of education: N/A   Occupational History  . Not on file.   Social History Main Topics  . Smoking status: Former Smoker    Quit date: 03/11/1963  . Smokeless tobacco: Never Used  . Alcohol use 0.0  oz/week    5 - 7 Glasses of wine per week  . Drug use: No  . Sexual activity: Not on file   Other Topics Concern  . Not on file   Social History Narrative  . No narrative on file  :  Pertinent items are noted in HPI.  Exam: @IPVITALS @  Well-developed and well-nourished white male in no obvious distress. Vital signs are temperature of 98.1. Pulse 85. Blood pressure 111/54. Weight is 170 pounds. Head and neck exam shows no ocular or oral lesion. There are no palpable cervical or supraclavicular lymph nodes. Lungs are clear. Cardiac exam regular rate and rhythm with no murmurs, rubs or bruits. Abdomen is soft. He has good bowel sounds. There is no fluid wave. There is no guarding or rebound tenderness. He has a small ventral abdominal wall hernia at a laparoscopy site. There is no tenderness in the flanks. He has no palpable liver or spleen tip. Back exam shows no tenderness over the spine, ribs or hips. Externa shows no clubbing, cyanosis or edema. Neurological exam shows no focal neurological deficit or skin exam shows numerous actinic keratoses. His skin has a lot of sun damage.    Recent Labs  05/08/16 0815  WBC 12.3*  HGB 11.1*  HCT 34.7*  PLT 390    Recent Labs  05/08/16 0815  NA 135  K 4.3  CL 106  CO2 25  GLUCOSE 108  BUN 14  CREATININE 1.1  CALCIUM 8.5    Blood smear review:  None  Pathology: None     Assessment and Plan:  Mr. Bill Foley is a 82 year old white male. Again, he is in very good shape.  He has a mass in the right kidney. He has rectoperineal adenopathy.  I have to believe at this is a primary renal cell carcinoma. I would think that this is a clear-cell carcinoma.  By the scan, I would think that he would be classified as having stage III disease. Fairly confident that the PET scan will not show any distant site of disease.  Because of his good health, I believe that he would be a good candidate for partial nephrectomy or total nephrectomy. His  disease could clearly be resected. The adenopathy could also be resected.  I think even if he does have metastatic disease, that taking the primary mass that would help with his night sweats.  I talked to Mr. Behrle at length. I spent about an hour with him. I explained  to him the CT scan results. I explained my recommendations.  I just don't think that he needs a biopsy. I don't think a biopsy is indicated when surgery is contemplated.  I will speak with one of the urologist who deals with kidney cancer. Dr. Alinda Money is very skilled and I think would also recommend surgical resection and debulking of this mass and adenopathy.  I told him that because of his service and charcoal to, that we would serve him and make sure that an aggressive course of action is undertaken.  We will plan to get him back after, what I hope, will be surgery. He clearly may need "adjuvant" therapy. There is more information now regarding the use of Sutent in high risk renal cell carcinomas that been resected.

## 2016-05-09 LAB — PREALBUMIN: PREALBUMIN: 7 mg/dL — ABNORMAL LOW (ref 9–32)

## 2016-05-10 ENCOUNTER — Other Ambulatory Visit (HOSPITAL_COMMUNITY): Payer: Self-pay | Admitting: Family Medicine

## 2016-05-10 ENCOUNTER — Inpatient Hospital Stay
Admission: RE | Admit: 2016-05-10 | Discharge: 2016-05-10 | Disposition: A | Discharge status: Home / Self Care | Payer: Self-pay | Source: Ambulatory Visit | Attending: Family Medicine | Admitting: Family Medicine

## 2016-05-10 DIAGNOSIS — R52 Pain, unspecified: Secondary | ICD-10-CM

## 2016-05-10 DIAGNOSIS — N2889 Other specified disorders of kidney and ureter: Secondary | ICD-10-CM

## 2016-05-11 ENCOUNTER — Telehealth: Payer: Self-pay

## 2016-05-11 ENCOUNTER — Other Ambulatory Visit: Payer: Self-pay | Admitting: Hematology & Oncology

## 2016-05-11 NOTE — Telephone Encounter (Signed)
Per Dr Marin Olp, US biopsy scheduled for 9/1 cancelled with [facility] in radiology scheduling. Pt will be having surgery instead. dph

## 2016-05-14 ENCOUNTER — Other Ambulatory Visit: Payer: Self-pay | Admitting: Hematology & Oncology

## 2016-05-14 DIAGNOSIS — N2889 Other specified disorders of kidney and ureter: Secondary | ICD-10-CM

## 2016-05-15 ENCOUNTER — Encounter: Payer: Self-pay | Admitting: Gastroenterology

## 2016-05-15 ENCOUNTER — Encounter (INDEPENDENT_AMBULATORY_CARE_PROVIDER_SITE_OTHER): Payer: Self-pay

## 2016-05-15 ENCOUNTER — Ambulatory Visit (INDEPENDENT_AMBULATORY_CARE_PROVIDER_SITE_OTHER): Payer: Medicare HMO | Admitting: Gastroenterology

## 2016-05-15 VITALS — BP 126/74 | HR 68 | Ht 72.0 in | Wt 173.0 lb

## 2016-05-15 DIAGNOSIS — N2889 Other specified disorders of kidney and ureter: Secondary | ICD-10-CM

## 2016-05-15 DIAGNOSIS — K5901 Slow transit constipation: Secondary | ICD-10-CM | POA: Diagnosis not present

## 2016-05-15 DIAGNOSIS — Z860100 Personal history of colon polyps, unspecified: Secondary | ICD-10-CM

## 2016-05-15 NOTE — Patient Instructions (Signed)
If you are age 80 or older, your body mass index should be between 23-30. Your Body mass index is 23.46 kg/m. If this is out of the aforementioned range listed, please consider follow up with your Primary Care Provider.  If you are age 64 or younger, your body mass index should be between 19-25. Your Body mass index is 23.46 kg/m. If this is out of the aformentioned range listed, please consider follow up with your Primary Care Provider.   Thank you for choosing  GI  Dr Henry Danis III  

## 2016-05-15 NOTE — Progress Notes (Signed)
[facility] Consult Note:  History: Bill Foley 05/15/2016  Referring physician: Phineas Inches, MD  Reason for consult/chief complaint: Colonoscopy (Due for recall colon, hx of polyps); Abdominal Pain (LLQ abd discomfort for several months); and Constipation (x 2 weeks)   Subjective  HPI:  This patient was referred by primary care for constipation and abdominal pain and history of colon polyps. He came to me for another opinion after previously being cared for by Dr. Paulita Fujita. Bill Foley reports a long time history of colon polyps, and says his last colonoscopy in 2014 may have had 9 polyps, one of which required surgery on the cecum. He says he then went back to see Dr. Paulita Fujita, who felt that at his age another colonoscopy was not necessary. Bill Foley reports some recent constipation has led to some intermittent left lower quadrant crampy discomfort before bowel movements. He denies rectal bleeding. [name] feels like he should have a colonoscopy "because I want to do whatever I can to be healthy". In the last several months he had fatigue and weight loss and has been discovered to have a left renal mass that is suspicious for malignancy. He was a tangential and somewhat confused historian today. I'm afraid his care has also been some spread over different offices in health systems. I was able to determine that he saw a surgeon (? Urology) who has a surgical date planned, but there is also an upcoming biopsy that might change this.  Bill Foley saw an oncologist in [facility], but we do not have that report. We were able to obtain a CT/PET scan report from last week. This indicates a left renal mass that is very worrisome for malignancy, there is marked adjacent adenopathy with some involvement in the musculature of the retroperitoneum, also bilateral hyperintense lesions at the base of the lungs and a hyperintense lesion in the left supraclavicular region. This may be where the biopsy is  planned.   ROS:  Review of Systems  Constitutional: Positive for unexpected weight change. Negative for appetite change.       Night sweats  HENT: Negative for mouth sores and voice change.   Eyes: Negative for pain and redness.  Respiratory: Negative for cough and shortness of breath.   Cardiovascular: Negative for chest pain and palpitations.  Genitourinary: Negative for dysuria and hematuria.  Musculoskeletal: Positive for back pain. Negative for arthralgias and myalgias.  Skin: Negative for pallor and rash.  Neurological: Negative for weakness and headaches.  Hematological: Negative for adenopathy.     Past Medical History: Past Medical History:  Diagnosis Date  . Aortic aneurysm ([place])   . Aortic aneurysm ([facility])    "JUST UNDER 2 CM"  . Arthritis    RT WRIST  . Basal cell carcinoma   . Gallstones   . GERD (gastroesophageal reflux disease)    OCCASIONAL  . Hyperlipidemia   . Hypertension   . Shortness of breath    WITH EXERTION  . Slow heart rate    "has been down to 45 in the past"  . Tinnitus   . Weak urinary stream      Past Surgical History: Past Surgical History:  Procedure Laterality Date  . ANKLE SURGERY Left 1975  . CHOLECYSTECTOMY N/A 04/08/2013   Procedure: LAPAROSCOPIC CHOLECYSTECTOMY WITH INTRAOPERATIVE CHOLANGIOGRAM;  Surgeon: Imogene Burn. Georgette Dover, MD;  [facility]: [facility];  Service: General;  Laterality: N/A;  . COLONOSCOPY    . INGUINAL HERNIA REPAIR Bilateral 08/06/2013   Procedure: HERNIA REPAIR INGUINAL ADULT  BILATERAL;  Surgeon: Imogene Burn. Georgette Dover, MD;  Location: [place];  Service: General;  Laterality: Bilateral;  . LAPAROSCOPIC APPENDECTOMY N/A 04/08/2013   Procedure: LAPAROSCOPIC PARTIAL CECECTOMY;  Surgeon: Imogene Burn. Georgette Dover, MD;  [facility];  Service: General;  Laterality: N/A;  . TONSILLECTOMY    . TOTAL KNEE ARTHROPLASTY  2007   RT  . VASECTOMY       Family History: Family History  Problem Relation Age of Onset  .  Cancer Mother   . Alcohol abuse Father     Social History: Social History   Social History  . Marital status: Married    Spouse name: N/A  . Number of children: N/A  . Years of education: N/A   Social History Main Topics  . Smoking status: Former Smoker    Quit date: 03/11/1963  . Smokeless tobacco: Never Used  . Alcohol use 0.0 oz/week    5 - 7 Glasses of wine per week  . Drug use: No  . Sexual activity: Not Asked   Other Topics Concern  . None   Social History Narrative  . None    Allergies: No Known Allergies  Outpatient Meds: Current Outpatient Prescriptions  Medication Sig Dispense Refill  . amLODipine (NORVASC) 10 MG tablet Take 1 tablet (10 mg total) by mouth daily. 90 tablet 3  . aspirin 81 MG tablet Take 81 mg by mouth 2 (two) times a week.     Marland Kitchen ibuprofen (ADVIL,MOTRIN) 200 MG tablet Take 200 mg by mouth every 6 (six) hours as needed for pain.    Javier Docker Oil 300 MG CAPS Take 300 mg by mouth every Monday, Wednesday, and Friday.     . losartan (COZAAR) 100 MG tablet Take 100 mg by mouth every morning.     Marland Kitchen OVER THE COUNTER MEDICATION Omega Red Takes M/W/F    . pravastatin (PRAVACHOL) 40 MG tablet Take 40 mg by mouth every evening.      No current facility-administered medications for this visit.       ___________________________________________________________________ Objective   Exam:  BP 126/74   Pulse 68   Ht 6' (1.829 m)   Wt 173 lb (78.5 kg)   BMI 23.46 kg/m    General: this is a(n) Thin elderly man, no acute distress, pleasant affect   Eyes: sclera anicteric, no redness  ENT: oral mucosa moist without lesions, no cervical or supraclavicular lymphadenopathy, good dentition  CV: RRR without murmur, S1/S2, no JVD, no peripheral edema  Resp: clear to auscultation bilaterally, normal RR and effort noted  GI: soft, no tenderness, with active bowel sounds. No guarding or palpable organomegaly noted.  Skin; warm and dry, no rash or  jaundice noted  Neuro: awake, alert and oriented x 3. Normal gross motor function and fluent speech  Labs:  Multiple lab reports reviewed in filed in chart. Alkaline phosphatase 136 WBC 10.4 hemoglobin 12 platelets 242  Radiologic Studies:  Imaging as noted above. There are no hyperintense lesions seen in the digestive tract on this PET scan. There is no report of any colon lesions or evidence of obstruction.  Assessment: Encounter Diagnoses  Name Primary?  . Slow transit constipation Yes  . Renal mass   . History of colonic polyps     The constipation does not appear to be obstructive, I do not suspect there is colonic neoplasia as a cause. He most likely has renal cell carcinoma, lymphoma is apparently felt to be less likely. hHwever,  this is why a biopsy is planned.  Plan:  I do not think she also needs a colonoscopy at this point. He certainly does not needed for history of colon polyps in light of his recent malignancy diagnosis, and is not clear that it will ever be necessary at his age. He isn't divided to see me after treatment for his malignancy at which time I would need to get reports from his last colonoscopy and pathology results to determine if the risk benefit ratio favors routine surveillance colonoscopy. It would seem unlikely at this point. As needed MiraLAX is recommended for constipation.  Thank you for the courtesy of this consult.  Please call me with any questions or concerns.  Nelida Meuse III  CC: Phineas Inches, MD

## 2016-05-16 ENCOUNTER — Other Ambulatory Visit: Payer: Self-pay | Admitting: Radiology

## 2016-05-16 ENCOUNTER — Other Ambulatory Visit: Payer: Self-pay | Admitting: General Surgery

## 2016-05-17 ENCOUNTER — Telehealth: Payer: Self-pay | Admitting: *Deleted

## 2016-05-17 NOTE — Telephone Encounter (Signed)
Patient called asking if he should keep his biopsy appointment because he was told not to do a kidney biopsy.  Dr. Marin Olp asked about this.  He states they do not want to remove his kidney so now he does need to get a kidney biopsy.  Patient understands this.

## 2016-05-18 ENCOUNTER — Ambulatory Visit (HOSPITAL_COMMUNITY): Payer: Medicare HMO

## 2016-05-18 ENCOUNTER — Ambulatory Visit (HOSPITAL_COMMUNITY)
Admission: RE | Admit: 2016-05-18 | Discharge: 2016-05-18 | Disposition: A | Discharge status: Home / Self Care | Payer: Medicare HMO | Source: Ambulatory Visit | Attending: Family Medicine | Admitting: Family Medicine

## 2016-05-18 ENCOUNTER — Encounter (HOSPITAL_COMMUNITY): Payer: Self-pay

## 2016-05-18 DIAGNOSIS — C770 Secondary and unspecified malignant neoplasm of lymph nodes of head, face and neck: Secondary | ICD-10-CM | POA: Diagnosis not present

## 2016-05-18 DIAGNOSIS — Z85828 Personal history of other malignant neoplasm of skin: Secondary | ICD-10-CM | POA: Diagnosis not present

## 2016-05-18 DIAGNOSIS — C801 Malignant (primary) neoplasm, unspecified: Secondary | ICD-10-CM | POA: Diagnosis not present

## 2016-05-18 DIAGNOSIS — N2889 Other specified disorders of kidney and ureter: Secondary | ICD-10-CM | POA: Diagnosis not present

## 2016-05-18 DIAGNOSIS — Z809 Family history of malignant neoplasm, unspecified: Secondary | ICD-10-CM | POA: Diagnosis not present

## 2016-05-18 DIAGNOSIS — E785 Hyperlipidemia, unspecified: Secondary | ICD-10-CM | POA: Insufficient documentation

## 2016-05-18 DIAGNOSIS — R599 Enlarged lymph nodes, unspecified: Secondary | ICD-10-CM | POA: Diagnosis present

## 2016-05-18 DIAGNOSIS — I10 Essential (primary) hypertension: Secondary | ICD-10-CM | POA: Insufficient documentation

## 2016-05-18 DIAGNOSIS — K219 Gastro-esophageal reflux disease without esophagitis: Secondary | ICD-10-CM | POA: Insufficient documentation

## 2016-05-18 DIAGNOSIS — Z87891 Personal history of nicotine dependence: Secondary | ICD-10-CM | POA: Insufficient documentation

## 2016-05-18 LAB — CBC
HEMATOCRIT: 33.4 % — AB (ref 39.0–52.0)
HEMOGLOBIN: 9.9 g/dL — AB (ref 13.0–17.0)
MCH: 23.8 pg — AB (ref 26.0–34.0)
MCHC: 29.6 g/dL — AB (ref 30.0–36.0)
MCV: 80.3 fL (ref 78.0–100.0)
Platelets: 335 10*3/uL (ref 150–400)
RBC: 4.16 MIL/uL — AB (ref 4.22–5.81)
RDW: 15.5 % (ref 11.5–15.5)
WBC: 10.5 10*3/uL (ref 4.0–10.5)

## 2016-05-18 LAB — PROTIME-INR
INR: 1.24
PROTHROMBIN TIME: 15.7 s — AB (ref 11.4–15.2)

## 2016-05-18 LAB — APTT: aPTT: 32 seconds (ref 24–36)

## 2016-05-18 MED ORDER — LIDOCAINE HCL 1 % IJ SOLN
INTRAMUSCULAR | Status: AC
  Filled 2016-05-18: qty 20

## 2016-05-18 MED ORDER — SODIUM CHLORIDE 0.9 % IV SOLN: INTRAVENOUS | Status: DC

## 2016-05-18 NOTE — Procedures (Signed)
[place] core L supraclav LAN 18g x3 to surg path No complication No blood loss. See complete dictation in [facility].

## 2016-05-18 NOTE — H&P (Signed)
Chief Complaint: renal mass  Referring Physician:Dr. Bernerd Limbo  Supervising Physician: Arne Cleveland  Patient Status:Out-pt  HPI: Bill Foley is an 82 y.o. male who was recently found to have a right renal mass.  This was suspicious for carcinoma.  He was sent for a PET scan which revealed a hypermetabolic right kidney mass with hypermetabolic diffuse adenopathy.  There was an intensely hypermetabolic left supraclavicular LN that was felt to be amenable to a percutaneous biopsy.  The patient has been referred to urology to be evaluated for surgical resection, but this PET scan shows diffuse metastatic disease and he was turned down for surgical resection.  He presents today for biopsy of this supraclavicular lymph node.  Past Medical History:  Past Medical History:  Diagnosis Date  . Aortic aneurysm ([place])   . Aortic aneurysm ([facility])    "JUST UNDER 2 CM"  . Arthritis    RT WRIST  . Basal cell carcinoma   . Gallstones   . GERD (gastroesophageal reflux disease)    OCCASIONAL  . Hyperlipidemia   . Hypertension   . Shortness of breath    WITH EXERTION  . Slow heart rate    "has been down to 45 in the past"  . Tinnitus   . Weak urinary stream     Past Surgical History:  Past Surgical History:  Procedure Laterality Date  . ANKLE SURGERY Left 1975  . CHOLECYSTECTOMY N/A 04/08/2013   Procedure: LAPAROSCOPIC CHOLECYSTECTOMY WITH INTRAOPERATIVE CHOLANGIOGRAM;  Surgeon: Imogene Burn. Georgette Dover, MD;  [facility];  Service: General;  Laterality: N/A;  . COLONOSCOPY    . INGUINAL HERNIA REPAIR Bilateral 08/06/2013   Procedure: HERNIA REPAIR INGUINAL ADULT BILATERAL;  Surgeon: Imogene Burn. Georgette Dover, MD;  Location: [place];  Service: General;  Laterality: Bilateral;  . LAPAROSCOPIC APPENDECTOMY N/A 04/08/2013   Procedure: LAPAROSCOPIC PARTIAL CECECTOMY;  Surgeon: Imogene Burn. Georgette Dover, MD;  [facility];  Service: General;  Laterality: N/A;  . TONSILLECTOMY    . TOTAL  KNEE ARTHROPLASTY  2007   RT  . VASECTOMY      Family History:  Family History  Problem Relation Age of Onset  . Cancer Mother   . Alcohol abuse Father     Social History:  reports that he quit smoking about 53 years ago. He has never used smokeless tobacco. He reports that he drinks alcohol. He reports that he does not use drugs.  Allergies: No Known Allergies  Medications: Medications   Please HPI for pertinent positives, otherwise complete 10 system ROS negative.  Mallampati Score: MD Evaluation Airway: WNL Heart: WNL Abdomen: WNL Chest/ Lungs: WNL ASA  Classification: 2 Mallampati/Airway Score: One  Physical Exam: BP 120/77   Pulse 72   Temp 97.8 F (36.6 C) (Oral)   Resp 18   Ht 6' (1.829 m)   Wt 172 lb (78 kg)   SpO2 99%   BMI 23.33 kg/m  Body mass index is 23.33 kg/m. General: pleasant, WD, WN white male who is laying in bed in NAD HEENT: head is normocephalic, atraumatic.  Sclera are noninjected.  PERRL.  Ears and nose without any masses or lesions.  Mouth is pink and moist Heart: regular, rate, and rhythm.  Normal s1,s2. No obvious murmurs, gallops, or rubs noted.  Palpable radial and pedal pulses bilaterally Lungs: CTAB, no wheezes, rhonchi, or rales noted.  Respiratory effort nonlabored Abd: soft, NT, ND, +BS, no masses, hernias, or organomegaly Psych: A&Ox3 with an  appropriate affect.   Labs: Pending  Imaging: No results found.  Assessment/Plan 1. Right renal mass, likely carcinoma with lymphadenopathy -we will plan to proceed today with a LN biopsy of the left supraclavicular node. -labs are pending -Risks and Benefits discussed with the patient including, but not limited to bleeding, infection, damage to adjacent structures or low yield requiring additional tests. All of the patient's questions were answered, patient is agreeable to proceed. Consent signed and in chart.  Thank you for this interesting consult.  I greatly enjoyed meeting  Bill Foley and look forward to participating in their care.  A copy of this report was sent to the requesting provider on this date.  Electronically Signed: Henreitta Cea 05/18/2016, 12:38 PM   I spent a total of  30 Minutes  in face to face in clinical consultation, greater than 50% of which was counseling/coordinating care for renal mass, left supraclavicular lymphadenopathy

## 2016-05-25 ENCOUNTER — Other Ambulatory Visit: Payer: Self-pay

## 2016-05-25 ENCOUNTER — Encounter: Payer: Self-pay | Admitting: Hematology & Oncology

## 2016-05-25 ENCOUNTER — Ambulatory Visit (HOSPITAL_BASED_OUTPATIENT_CLINIC_OR_DEPARTMENT_OTHER): Payer: Medicare HMO

## 2016-05-25 ENCOUNTER — Ambulatory Visit (HOSPITAL_BASED_OUTPATIENT_CLINIC_OR_DEPARTMENT_OTHER): Payer: Medicare HMO | Admitting: Hematology & Oncology

## 2016-05-25 VITALS — BP 127/53 | HR 76 | Temp 98.1°F | Resp 16 | Ht 72.0 in | Wt 168.0 lb

## 2016-05-25 DIAGNOSIS — D509 Iron deficiency anemia, unspecified: Secondary | ICD-10-CM

## 2016-05-25 DIAGNOSIS — D126 Benign neoplasm of colon, unspecified: Secondary | ICD-10-CM

## 2016-05-25 DIAGNOSIS — C641 Malignant neoplasm of right kidney, except renal pelvis: Secondary | ICD-10-CM

## 2016-05-25 DIAGNOSIS — C649 Malignant neoplasm of unspecified kidney, except renal pelvis: Secondary | ICD-10-CM

## 2016-05-25 DIAGNOSIS — C770 Secondary and unspecified malignant neoplasm of lymph nodes of head, face and neck: Secondary | ICD-10-CM | POA: Diagnosis not present

## 2016-05-25 DIAGNOSIS — C7800 Secondary malignant neoplasm of unspecified lung: Secondary | ICD-10-CM

## 2016-05-25 DIAGNOSIS — R319 Hematuria, unspecified: Secondary | ICD-10-CM

## 2016-05-25 DIAGNOSIS — D500 Iron deficiency anemia secondary to blood loss (chronic): Secondary | ICD-10-CM

## 2016-05-25 DIAGNOSIS — C7989 Secondary malignant neoplasm of other specified sites: Secondary | ICD-10-CM | POA: Diagnosis not present

## 2016-05-25 HISTORY — DX: Iron deficiency anemia secondary to blood loss (chronic): D50.0

## 2016-05-25 HISTORY — DX: Malignant neoplasm of right kidney, except renal pelvis: C64.1

## 2016-05-25 LAB — CBC WITH DIFFERENTIAL (CANCER CENTER ONLY)
BASO#: 0.1 10*3/uL (ref 0.0–0.2)
BASO%: 0.5 % (ref 0.0–2.0)
EOS%: 0.8 % (ref 0.0–7.0)
Eosinophils Absolute: 0.1 10*3/uL (ref 0.0–0.5)
HEMATOCRIT: 33.5 % — AB (ref 38.7–49.9)
HEMOGLOBIN: 10.4 g/dL — AB (ref 13.0–17.1)
LYMPH#: 1.4 10*3/uL (ref 0.9–3.3)
LYMPH%: 10.4 % — ABNORMAL LOW (ref 14.0–48.0)
MCH: 24 pg — ABNORMAL LOW (ref 28.0–33.4)
MCHC: 31 g/dL — AB (ref 32.0–35.9)
MCV: 77 fL — ABNORMAL LOW (ref 82–98)
MONO#: 1.3 10*3/uL — ABNORMAL HIGH (ref 0.1–0.9)
MONO%: 9.9 % (ref 0.0–13.0)
NEUT%: 78.4 % (ref 40.0–80.0)
NEUTROS ABS: 10.4 10*3/uL — AB (ref 1.5–6.5)
Platelets: 474 10*3/uL — ABNORMAL HIGH (ref 145–400)
RBC: 4.34 10*6/uL (ref 4.20–5.70)
RDW: 15.9 % — ABNORMAL HIGH (ref 11.1–15.7)
WBC: 13.2 10*3/uL — AB (ref 4.0–10.0)

## 2016-05-25 LAB — COMPREHENSIVE METABOLIC PANEL (CC13)
A/G RATIO: 0.7 — AB (ref 1.2–2.2)
ALT: 35 IU/L (ref 0–44)
AST: 18 IU/L (ref 0–40)
Albumin, Serum: 2.7 g/dL — ABNORMAL LOW (ref 3.5–4.7)
Alkaline Phosphatase, S: 199 IU/L — ABNORMAL HIGH (ref 39–117)
BILIRUBIN TOTAL: 0.6 mg/dL (ref 0.0–1.2)
BUN/Creatinine Ratio: 20 (ref 10–24)
BUN: 19 mg/dL (ref 8–27)
Calcium, Ser: 8.9 mg/dL (ref 8.6–10.2)
Carbon Dioxide, Total: 22 mmol/L (ref 18–29)
Chloride, Ser: 101 mmol/L (ref 96–106)
Creatinine, Ser: 0.93 mg/dL (ref 0.76–1.27)
GFR calc non Af Amer: 76 mL/min/{1.73_m2} (ref >59)
GFR, EST AFRICAN AMERICAN: 87 mL/min/{1.73_m2} (ref >59)
Globulin, Total: 3.8 g/dL (ref 1.5–4.5)
Glucose: 182 mg/dL — ABNORMAL HIGH (ref 65–99)
Potassium, Ser: 4.3 mmol/L (ref 3.5–5.2)
Sodium: 134 mmol/L (ref 134–144)
TOTAL PROTEIN: 6.5 g/dL (ref 6.0–8.5)

## 2016-05-25 MED ORDER — PAZOPANIB HCL 200 MG PO TABS: 600.0000 mg | ORAL_TABLET | Freq: Every day | ORAL | 4 refills | Status: DC

## 2016-05-25 NOTE — Progress Notes (Signed)
Hematology and Oncology Follow Up Visit  Bill Foley [id number] 12-07-1930 82 y.o. 05/25/2016   Principle Diagnosis:   Metastatic renal cell carcinoma - clear cell subtype  Iron deficiency anemia secondary to chronic blood loss  Current Therapy:    Votrient 600 mg by mouth daily  Xgeva 120 mg subcutaneous every month  IV iron as indicated     Interim History:  Bill Foley is back for follow-up. We now have a diagnosis. He did get a biopsy of a left supraclavicular lymph node. This is done on September 1. The pathology report [name]) was positive for metastatic carcinoma. It was consistent with renal cell carcinoma. I would have to believe that this is clear cell carcinoma.  We did do a PET scan on him. This showed much more disease than I would've thought. He had the activity in the upper pole of the left kidney. He had bulky periaortic adenopathy. He had a muscular metastasis adjacent to the right kidney. He had pulmonary metastasis.  When I first saw him, I was surprised as prealbumin was so low. It was only 7.  He has slightly elevated calcium. His corrected calcium is probably close to 11.  He is not felt to be a candidate for palliative nephrectomy. The urologist thinks that there is too much disease.  He comes in with his wife. He is holding his weight pretty well. He has had no nausea or vomiting. He's had some lower abdominal pain which might be the adenopathy.  There is some intermittent hematuria.  He has had no diarrhea.  Overall, his performance status is ECOG 1.                ons:  Current Outpatient Prescriptions:  .  amLODipine (NORVASC) 10 MG tablet, Take 1 tablet (10 mg total) by mouth daily., Disp: 90 tablet, Rfl: 3 .  aspirin 81 MG tablet, Take 81 mg by mouth 2 (two) times a week. , Disp: , Rfl:  .  ibuprofen (ADVIL,MOTRIN) 200 MG tablet, Take 200 mg by mouth every 6 (six) hours as needed for pain., Disp: , Rfl:  .  Krill Oil 300 MG  CAPS, Take 300 mg by mouth every Monday, Wednesday, and Friday. , Disp: , Rfl:  .  losartan (COZAAR) 100 MG tablet, Take 100 mg by mouth every morning. , Disp: , Rfl:  .  pravastatin (PRAVACHOL) 40 MG tablet, Take 40 mg by mouth every evening. , Disp: , Rfl:  .  pazopanib (VOTRIENT) 200 MG tablet, Take 3 tablets (600 mg total) by mouth daily. Take on an empty stomach., Disp: 90 tablet, Rfl: 4  Allergies: Not on File  Past Medical History, Surgical history, Social history, and Family History were reviewed and updated.  Review of Systems:  as above  sical Exam:  height is 6' (1.829 m) and weight is 168 lb (76.2 kg). His oral temperature is 98.1 F (36.7 C). His blood pressure is 127/53 (abnormal) and his pulse is 76. His respiration is 16.   Wt Readings from Last 3 Encounters:  05/25/16 168 lb (76.2 kg)  05/18/16 172 lb (78 kg)  05/15/16 173 lb (78.5 kg)      Well-developed well-nourished somewhat thin white male who is elderly. Head exam shows no ocular or oral lesions. There are no palpable cervical or supraclavicular lymph nodes. Lungs are clear bilaterally. Cardiac exam regular rate and rhythm with no murmurs, rubs or bruits. Abdomen is soft. He has good bowel sounds. There  is no fluid wave. There is no palpable liver or spleen tip. Back exam shows no tenderness over the spine, ribs or hips. Extremities shows no clubbing, cyanosis or edema. Skin exam shows no rashes, ecchymoses or petechia. Neurological exam shows no focal neurological deficits.  Lab Results  Component Value Date   WBC 13.2 (H) 05/25/2016   HGB 10.4 (L) 05/25/2016   HCT 33.5 (L) 05/25/2016   MCV 77 (L) 05/25/2016   PLT 474 (H) 05/25/2016     Chemistry      Component Value Date/Time   NA 135 05/08/2016 0815   K 4.3 05/08/2016 0815   CL 106 05/08/2016 0815   CO2 25 05/08/2016 0815   BUN 14 05/08/2016 0815   CREATININE 1.1 05/08/2016 0815      Component Value Date/Time   CALCIUM 8.5 05/08/2016 0815    ALKPHOS 169 (H) 05/08/2016 0815   AST 35 05/08/2016 0815   ALT 56 (H) 05/08/2016 0815   BILITOT 1.30 05/08/2016 0815         Impression and Plan: Bill Foley isa 82 year old white male. He has metastatic kidney cancer.  He does have quite a bit of disease. His prealbumin is not that great.  I talked to he and his wife at length. I told him that this is some that is not curable. Our goal is to try to decrease the amount of disease and this will help him with weight gain, appetite, and quality of life and quantity of life.  I think that Votrient would be a reasonable option for him. I think he can tolerate this. I would not use full dose Votrient. I would use reduced dose. I'll try him on 600 mg a day. Hopefully he can tolerate this. I went over side effects with him. I told he and his wife that the chance of responding to Votrient hopefully will be a little bit more than 50%.  I told he and his wife that if Votrient did not work, that I do not think he would make it more than 4-6 months. ER he has lost some weight. His prealbumin is low. I think if Votrient does work, then we might be able to look at a year and a half or so. He and his wife both understand this.  I suspect that he also has iron deficiency. I'm sending off iron studies. On his blood smear, with slight he does have some iron deficiency.  He will also need some Xgeva. I think this would be reasonable.  I spent about 45 minutes with he and his wife. This is a tough problem that he has.  Hopefully, we will get the Votrient for him and start this next week.  I will like to see him back in 3 weeks.    Volanda Napoleon, MD 9/8/20175:35 PM

## 2016-05-26 LAB — PREALBUMIN: PREALBUMIN: 4 mg/dL — AB (ref 9–32)

## 2016-05-28 ENCOUNTER — Encounter: Payer: Self-pay | Admitting: Nurse Practitioner

## 2016-05-28 LAB — FERRITIN: Ferritin: 683 ng/ml — ABNORMAL HIGH (ref 22–316)

## 2016-05-28 LAB — IRON AND TIBC
Iron: <11 ug/dL — ABNORMAL LOW (ref 42–163)
TIBC: 158 ug/dL — AB (ref 202–409)

## 2016-05-28 LAB — LACTATE DEHYDROGENASE: LDH: 134 U/L (ref 125–245)

## 2016-05-28 NOTE — Progress Notes (Signed)
Received a  Call from [place], [place] with [employer] regarding PA of his [place]. Clinical information faxed and received to  [phone]. Also ICD C64.1 given per records and clinical questions answered via phone. Representative stated she had to wait on an official approval but based on information received he did meet qualifications. If we had any further questions pending PA process wait time we could contact them back at [phone]

## 2016-05-29 ENCOUNTER — Telehealth: Payer: Self-pay | Admitting: *Deleted

## 2016-05-29 NOTE — Telephone Encounter (Signed)
Received  Call from [employer] stating that patient Votrient approved and was approved for paitent assistance.  Will be shipped tomorroe.

## 2016-06-06 ENCOUNTER — Telehealth: Payer: Self-pay

## 2016-06-06 NOTE — Telephone Encounter (Signed)
Received call from [facility] with [employer] providing approval for pt's Xgeva from 06/06/2016 through 06/06/2017. Approval # [id number]

## 2016-06-15 ENCOUNTER — Other Ambulatory Visit: Payer: Self-pay | Admitting: *Deleted

## 2016-06-15 DIAGNOSIS — C641 Malignant neoplasm of right kidney, except renal pelvis: Secondary | ICD-10-CM

## 2016-06-15 DIAGNOSIS — D500 Iron deficiency anemia secondary to blood loss (chronic): Secondary | ICD-10-CM

## 2016-06-18 ENCOUNTER — Other Ambulatory Visit (HOSPITAL_BASED_OUTPATIENT_CLINIC_OR_DEPARTMENT_OTHER): Payer: Medicare HMO

## 2016-06-18 ENCOUNTER — Ambulatory Visit (HOSPITAL_BASED_OUTPATIENT_CLINIC_OR_DEPARTMENT_OTHER): Payer: Medicare HMO

## 2016-06-18 ENCOUNTER — Ambulatory Visit (HOSPITAL_BASED_OUTPATIENT_CLINIC_OR_DEPARTMENT_OTHER): Payer: Medicare HMO | Admitting: Hematology & Oncology

## 2016-06-18 ENCOUNTER — Encounter: Payer: Self-pay | Admitting: Hematology & Oncology

## 2016-06-18 VITALS — BP 123/79 | HR 70 | Temp 97.3°F | Resp 16 | Ht 72.0 in | Wt 162.0 lb

## 2016-06-18 VITALS — BP 129/76 | HR 76 | Temp 98.0°F

## 2016-06-18 DIAGNOSIS — D500 Iron deficiency anemia secondary to blood loss (chronic): Secondary | ICD-10-CM

## 2016-06-18 DIAGNOSIS — R319 Hematuria, unspecified: Secondary | ICD-10-CM

## 2016-06-18 DIAGNOSIS — R110 Nausea: Secondary | ICD-10-CM | POA: Diagnosis not present

## 2016-06-18 DIAGNOSIS — C649 Malignant neoplasm of unspecified kidney, except renal pelvis: Secondary | ICD-10-CM

## 2016-06-18 DIAGNOSIS — C641 Malignant neoplasm of right kidney, except renal pelvis: Secondary | ICD-10-CM | POA: Diagnosis not present

## 2016-06-18 DIAGNOSIS — Z23 Encounter for immunization: Secondary | ICD-10-CM

## 2016-06-18 DIAGNOSIS — R64 Cachexia: Secondary | ICD-10-CM

## 2016-06-18 LAB — CBC WITH DIFFERENTIAL (CANCER CENTER ONLY)
BASO#: 0.1 10*3/uL (ref 0.0–0.2)
BASO%: 1.6 % (ref 0.0–2.0)
EOS%: 6.8 % (ref 0.0–7.0)
Eosinophils Absolute: 0.6 10*3/uL — ABNORMAL HIGH (ref 0.0–0.5)
HCT: 39.1 % (ref 38.7–49.9)
HGB: 12.4 g/dL — ABNORMAL LOW (ref 13.0–17.1)
LYMPH#: 2.1 10*3/uL (ref 0.9–3.3)
LYMPH%: 23.9 % (ref 14.0–48.0)
MCH: 24.1 pg — ABNORMAL LOW (ref 28.0–33.4)
MCHC: 31.7 g/dL — ABNORMAL LOW (ref 32.0–35.9)
MCV: 76 fL — ABNORMAL LOW (ref 82–98)
MONO#: 0.7 10*3/uL (ref 0.1–0.9)
MONO%: 7.8 % (ref 0.0–13.0)
NEUT#: 5.3 10*3/uL (ref 1.5–6.5)
NEUT%: 59.9 % (ref 40.0–80.0)
PLATELETS: 160 10*3/uL (ref 145–400)
RBC: 5.14 10*6/uL (ref 4.20–5.70)
RDW: 20.1 % — ABNORMAL HIGH (ref 11.1–15.7)
WBC: 8.8 10*3/uL (ref 4.0–10.0)

## 2016-06-18 LAB — CMP (CANCER CENTER ONLY)
ALK PHOS: 128 U/L — AB (ref 26–84)
ALT: 50 U/L — AB (ref 10–47)
AST: 34 U/L (ref 11–38)
Albumin: 2.7 g/dL — ABNORMAL LOW (ref 3.3–5.5)
BUN: 18 mg/dL (ref 7–22)
CO2: 31 mEq/L (ref 18–33)
CREATININE: 1.1 mg/dL (ref 0.6–1.2)
Calcium: 8.7 mg/dL (ref 8.0–10.3)
Chloride: 102 mEq/L (ref 98–108)
Glucose, Bld: 147 mg/dL — ABNORMAL HIGH (ref 73–118)
Potassium: 4 mEq/L (ref 3.3–4.7)
SODIUM: 141 meq/L (ref 128–145)
TOTAL PROTEIN: 6.5 g/dL (ref 6.4–8.1)
Total Bilirubin: 1 mg/dl (ref 0.20–1.60)

## 2016-06-18 LAB — LACTATE DEHYDROGENASE: LDH: 262 U/L — AB (ref 125–245)

## 2016-06-18 LAB — TECHNOLOGIST REVIEW CHCC SATELLITE

## 2016-06-18 MED ORDER — INFLUENZA VAC SPLIT QUAD 0.5 ML IM SUSY
0.5000 mL | PREFILLED_SYRINGE | Freq: Once | INTRAMUSCULAR | Status: AC
  Administered 2016-06-18: 0.5 mL via INTRAMUSCULAR
  Filled 2016-06-18: qty 0.5

## 2016-06-18 MED ORDER — DENOSUMAB 120 MG/1.7ML ~~LOC~~ SOLN: 120.0000 mg | Freq: Once | SUBCUTANEOUS | Status: DC

## 2016-06-18 MED ORDER — SODIUM CHLORIDE 0.9 % IV SOLN
510.0000 mg | Freq: Once | INTRAVENOUS | Status: AC
  Administered 2016-06-18: 510 mg via INTRAVENOUS
  Filled 2016-06-18: qty 17

## 2016-06-18 MED ORDER — SODIUM CHLORIDE 0.9 % IV SOLN
Freq: Once | INTRAVENOUS | Status: AC
  Administered 2016-06-18: 14:00:00 via INTRAVENOUS

## 2016-06-18 NOTE — Progress Notes (Signed)
Hematology and Oncology Follow Up Visit  Bill Foley:[id number] 1931/07/02 82 y.o. 06/18/2016   Principle Diagnosis:   Metastatic renal cell carcinoma - clear cell subtype  Iron deficiency anemia secondary to chronic blood loss  Current Therapy:    Votrient 600 mg by mouth daily - started 05/27/2016  Xgeva 120 mg subcutaneous every month  IV iron as indicated - dose given 06/18/2016     Interim History:  Bill Foley is back for follow-up. He is doing okay. He says he has no longer any hot flashes or night sweats. Hopefully this is a sign that things might be working with the treatment.  He says that he has some nausea in the morning. He takes Votrient at nighttime. I told him that we could try some Zofran at nighttime when he takes the Votrient to see if this does not help with the morning sickness.  His weight is down a little bit more. He says he is eating a little bit better.  He is not complaining of a lot of pain.  He has had no fever. He has had no obvious bleeding. He says his urine is not as dark.   There's been a little bit of a cough. He has had no obvious shortness of breath. His energy is not as great. He is iron deficient. I'll give him a dose of iron today.  Of note, back in early September, his prealbumin was only 4. We really have to try to pick this up if we can.   He has had no headache. He's had no leg swelling. He has had no diarrhea.  Overall, his performance status is ECOG 1. ons:  Current Outpatient Prescriptions:  .  amLODipine (NORVASC) 10 MG tablet, Take 1 tablet (10 mg total) by mouth daily., Disp: 90 tablet, Rfl: 3 .  aspirin 81 MG tablet, Take 81 mg by mouth 2 (two) times a week. , Disp: , Rfl:  .  ibuprofen (ADVIL,MOTRIN) 200 MG tablet, Take 200 mg by mouth every 6 (six) hours as needed for pain., Disp: , Rfl:  .  Krill Oil 300 MG CAPS, Take 300 mg by mouth every Monday, Wednesday, and Friday. , Disp: , Rfl:  .  losartan (COZAAR) 100 MG  tablet, Take 100 mg by mouth every morning. , Disp: , Rfl:  .  pazopanib (VOTRIENT) 200 MG tablet, Take 3 tablets (600 mg total) by mouth daily. Take on an empty stomach., Disp: 90 tablet, Rfl: 4 .  pravastatin (PRAVACHOL) 40 MG tablet, Take 40 mg by mouth every evening. , Disp: , Rfl:   Allergies: No Known Allergies  Past Medical History, Surgical history, Social history, and Family History were reviewed and updated.  Review of Systems:  as above  sical Exam:  height is 6' (1.829 m) and weight is 162 lb (73.5 kg). His oral temperature is 97.3 F (36.3 C). His blood pressure is 123/79 and his pulse is 70. His respiration is 16.   Wt Readings from Last 3 Encounters:  06/18/16 162 lb (73.5 kg)  05/25/16 168 lb (76.2 kg)  05/18/16 172 lb (78 kg)      Well-developed well-nourished somewhat thin white male who is elderly. Head exam shows no ocular or oral lesions. There are no palpable cervical or supraclavicular lymph nodes. Lungs are clear bilaterally. Cardiac exam regular rate and rhythm with no murmurs, rubs or bruits. Abdomen is soft. He has good bowel sounds. There is no fluid wave. There is no  palpable liver or spleen tip. Back exam shows no tenderness over the spine, ribs or hips. Extremities shows no clubbing, cyanosis or edema. Skin exam shows no rashes, ecchymoses or petechia. Neurological exam shows no focal neurological deficits.  Lab Results  Component Value Date   WBC 8.8 06/18/2016   HGB 12.4 (L) 06/18/2016   HCT 39.1 06/18/2016   MCV 76 (L) 06/18/2016   PLT 160 06/18/2016     Chemistry      Component Value Date/Time   NA 141 06/18/2016 1128   K 4.0 06/18/2016 1128   CL 102 06/18/2016 1128   CO2 31 06/18/2016 1128   BUN 18 06/18/2016 1128   CREATININE 1.1 06/18/2016 1128      Component Value Date/Time   CALCIUM 8.7 06/18/2016 1128   ALKPHOS 128 (H) 06/18/2016 1128   AST 34 06/18/2016 1128   ALT 50 (H) 06/18/2016 1128   BILITOT 1.00 06/18/2016 1128          Impression and Plan: Bill Foley isa 82 year old white male. He has metastatic kidney cancer.  He does have quite a bit of disease. His prealbumin is not that great.  I talked to he and his wife at length. I told him that this is some that is not curable. Our goal is to try to decrease the amount of disease and this will help him with weight gain, appetite, and quality of life and quantity of life.  Hopefully, the iron will help him more than anything right now. He has been on the Motrin for about 3 weeks or so. It still may be a little bit early to know how well this is working. I think the fact that he has no night sweats is a positive sign. His LDH is up somewhat. This may be considered a negative factor.   I will like to see him back in about one month.  I think if I needs a good 3 months of Votrient before we do any scans on him.   I think that his weight will tell us how he will do.   I spent about 30 minutes with him today.    Volanda Napoleon, MD 10/2/20176:11 PM

## 2016-06-18 NOTE — Patient Instructions (Signed)

## 2016-07-10 ENCOUNTER — Other Ambulatory Visit: Payer: Self-pay | Admitting: Family

## 2016-07-27 ENCOUNTER — Other Ambulatory Visit: Payer: Self-pay | Admitting: Family

## 2016-07-27 ENCOUNTER — Ambulatory Visit (HOSPITAL_BASED_OUTPATIENT_CLINIC_OR_DEPARTMENT_OTHER): Payer: Medicare HMO | Admitting: Hematology & Oncology

## 2016-07-27 ENCOUNTER — Other Ambulatory Visit (HOSPITAL_BASED_OUTPATIENT_CLINIC_OR_DEPARTMENT_OTHER): Payer: Medicare HMO

## 2016-07-27 ENCOUNTER — Encounter: Payer: Self-pay | Admitting: Hematology & Oncology

## 2016-07-27 ENCOUNTER — Ambulatory Visit (HOSPITAL_BASED_OUTPATIENT_CLINIC_OR_DEPARTMENT_OTHER): Payer: Medicare HMO

## 2016-07-27 VITALS — BP 132/80 | HR 72 | Temp 97.5°F | Wt 153.0 lb

## 2016-07-27 DIAGNOSIS — C799 Secondary malignant neoplasm of unspecified site: Secondary | ICD-10-CM

## 2016-07-27 DIAGNOSIS — D500 Iron deficiency anemia secondary to blood loss (chronic): Secondary | ICD-10-CM

## 2016-07-27 DIAGNOSIS — Z23 Encounter for immunization: Secondary | ICD-10-CM

## 2016-07-27 DIAGNOSIS — E860 Dehydration: Secondary | ICD-10-CM | POA: Diagnosis not present

## 2016-07-27 DIAGNOSIS — R64 Cachexia: Secondary | ICD-10-CM

## 2016-07-27 DIAGNOSIS — C641 Malignant neoplasm of right kidney, except renal pelvis: Secondary | ICD-10-CM

## 2016-07-27 LAB — CBC WITH DIFFERENTIAL (CANCER CENTER ONLY)
BASO#: 0 10*3/uL (ref 0.0–0.2)
BASO%: 0.4 % (ref 0.0–2.0)
EOS ABS: 0.1 10*3/uL (ref 0.0–0.5)
EOS%: 1.4 % (ref 0.0–7.0)
HCT: 39.4 % (ref 38.7–49.9)
HGB: 13.2 g/dL (ref 13.0–17.1)
LYMPH#: 2.7 10*3/uL (ref 0.9–3.3)
LYMPH%: 27 % (ref 14.0–48.0)
MCH: 27.6 pg — AB (ref 28.0–33.4)
MCHC: 33.5 g/dL (ref 32.0–35.9)
MCV: 82 fL (ref 82–98)
MONO#: 0.8 10*3/uL (ref 0.1–0.9)
MONO%: 7.8 % (ref 0.0–13.0)
NEUT#: 6.2 10*3/uL (ref 1.5–6.5)
NEUT%: 63.4 % (ref 40.0–80.0)
PLATELETS: 184 10*3/uL (ref 145–400)
RBC: 4.78 10*6/uL (ref 4.20–5.70)
RDW: 26.1 % — AB (ref 11.1–15.7)
WBC: 9.8 10*3/uL (ref 4.0–10.0)

## 2016-07-27 LAB — CMP (CANCER CENTER ONLY)
ALT(SGPT): 42 U/L (ref 10–47)
AST: 32 U/L (ref 11–38)
Albumin: 3 g/dL — ABNORMAL LOW (ref 3.3–5.5)
Alkaline Phosphatase: 105 U/L — ABNORMAL HIGH (ref 26–84)
BUN: 22 mg/dL (ref 7–22)
CHLORIDE: 105 meq/L (ref 98–108)
CO2: 28 meq/L (ref 18–33)
Calcium: 9.2 mg/dL (ref 8.0–10.3)
Creat: 1.2 mg/dl (ref 0.6–1.2)
GLUCOSE: 109 mg/dL (ref 73–118)
POTASSIUM: 4.2 meq/L (ref 3.3–4.7)
Sodium: 147 mEq/L — ABNORMAL HIGH (ref 128–145)
TOTAL PROTEIN: 6.9 g/dL (ref 6.4–8.1)
Total Bilirubin: 1.1 mg/dl (ref 0.20–1.60)

## 2016-07-27 LAB — LACTATE DEHYDROGENASE: LDH: 289 U/L — AB (ref 125–245)

## 2016-07-27 MED ORDER — FAMOTIDINE IN NACL 20-0.9 MG/50ML-% IV SOLN
40.0000 mg | Freq: Once | INTRAVENOUS | Status: AC
  Administered 2016-07-27: 40 mg via INTRAVENOUS

## 2016-07-27 MED ORDER — SODIUM CHLORIDE 0.9 % IV SOLN
20.0000 mg | Freq: Once | INTRAVENOUS | Status: AC
  Administered 2016-07-27: 20 mg via INTRAVENOUS
  Filled 2016-07-27: qty 2

## 2016-07-27 MED ORDER — FAMOTIDINE IN NACL 20-0.9 MG/50ML-% IV SOLN
INTRAVENOUS | Status: AC
  Filled 2016-07-27: qty 100

## 2016-07-27 MED ORDER — SODIUM CHLORIDE 0.9 % IV SOLN
INTRAVENOUS | Status: AC
  Administered 2016-07-27: 10:00:00 via INTRAVENOUS

## 2016-07-27 MED ORDER — DENOSUMAB 120 MG/1.7ML ~~LOC~~ SOLN
120.0000 mg | Freq: Once | SUBCUTANEOUS | Status: AC
  Administered 2016-07-27: 120 mg via SUBCUTANEOUS
  Filled 2016-07-27: qty 1.7

## 2016-07-27 NOTE — Progress Notes (Signed)
Hematology and Oncology Follow Up Visit  KIPP HONER [id number] 08/26/1931 82 y.o. 07/27/2016   Principle Diagnosis:   Metastatic renal cell carcinoma - clear cell subtype  Iron deficiency anemia secondary to chronic blood loss  Current Therapy:    Votrient 600 mg by mouth daily - started 05/27/2016  Xgeva 120 mg subcutaneous every month  IV iron as indicated - dose given 06/18/2016     Interim History:  [name] is back for follow-up. He is having issues. He's having a lot of diarrhea. I told him to stop the Votrient. On him off the Votrient until we get to see him back. He's been on Votrient now for a little over 2 months. He says diarrhea is watery. There is no blood. It is not black. He has lost weight.  He's had no night sweats or fevers. This stopped about 3 days after he started the Votrient.  He's had no cough or shortness of breath. He just does not have much of an appetite. He looks dehydrated.  He's had no leg swelling. He is lost muscle mass in his extremities.  He's had no rashes. He's had no pruritus.  Overall, his performance status is ECOG 2 ons:  Current Outpatient Prescriptions:  .  amLODipine (NORVASC) 10 MG tablet, Take 1 tablet (10 mg total) by mouth daily., Disp: 90 tablet, Rfl: 3 .  aspirin 81 MG tablet, Take 81 mg by mouth 2 (two) times a week. , Disp: , Rfl:  .  ibuprofen (ADVIL,MOTRIN) 200 MG tablet, Take 200 mg by mouth every 6 (six) hours as needed for pain., Disp: , Rfl:  .  Krill Oil 300 MG CAPS, Take 300 mg by mouth every Monday, Wednesday, and Friday. , Disp: , Rfl:  .  losartan (COZAAR) 100 MG tablet, Take 100 mg by mouth every morning. , Disp: , Rfl:  .  pazopanib (VOTRIENT) 200 MG tablet, Take 3 tablets (600 mg total) by mouth daily. Take on an empty stomach., Disp: 90 tablet, Rfl: 4 .  pravastatin (PRAVACHOL) 40 MG tablet, Take 40 mg by mouth every evening. , Disp: , Rfl:   Allergies: No Known Allergies  Past Medical History,  Surgical history, Social history, and Family History were reviewed and updated.  Review of Systems:  as above  sical Exam:  weight is 153 lb (69.4 kg). His oral temperature is 97.5 F (36.4 C). His blood pressure is 132/80 and his pulse is 72.   Wt Readings from Last 3 Encounters:  07/27/16 153 lb (69.4 kg)  06/18/16 162 lb (73.5 kg)  05/25/16 168 lb (76.2 kg)      Thin, somewhat dehydrated appearing white male who is elderly. Head exam shows no ocular or oral lesions. There are no palpable cervical or supraclavicular lymph nodes. Lungs are clear bilaterally. Cardiac exam regular rate and rhythm with no murmurs, rubs or bruits. Abdomen is soft. He has good bowel sounds. There is no fluid wave. There is no palpable liver or spleen tip. Back exam shows no tenderness over the spine, ribs or hips. Extremities shows no clubbing, cyanosis or edema. He has symmetric muscle atrophy in his arms and legs. Skin exam shows no rashes, ecchymoses or petechia. Neurological exam shows no focal neurological deficits.  Lab Results  Component Value Date   WBC 9.8 07/27/2016   HGB 13.2 07/27/2016   HCT 39.4 07/27/2016   MCV 82 07/27/2016   PLT 184 07/27/2016     Chemistry  Component Value Date/Time   NA 147 (H) 07/27/2016 0855   K 4.2 07/27/2016 0855   CL 105 07/27/2016 0855   CO2 28 07/27/2016 0855   BUN 22 07/27/2016 0855   CREATININE 1.2 07/27/2016 0855      Component Value Date/Time   CALCIUM 9.2 07/27/2016 0855   ALKPHOS 105 (H) 07/27/2016 0855   AST 32 07/27/2016 0855   ALT 42 07/27/2016 0855   BILITOT 1.10 07/27/2016 0855         Impression and Plan: [name] isa 82 year old white male. He has metastatic kidney cancer.  I am somewhat surprised about the diarrhea. Unfortunately, is not taking anything for it. Not sure as to why he never took anything for it. his wife actually used to work for hospice so she certainly would know what to do.  He clearly is dehydrated. We  will give some IV fluids. I'll give him a little Pepcid and some Decadron. Maybe the Decadron to help his appetite alow bit.  I will see about getting him on some Creon. This may help with the diarrhea. It may help him absorb a little bit more food.  I think we have to get him set with some scans we see him back. I think this would be helpful. Given that his alkaline phosphatase is coming down, this should be a sign that he is responding to treatment.   I spent about 35 minutes with he and his wife. I just feel bad that his quality of life is not doing too well right now. We are not going to cure him so I want his quality of life to be much better. Hopefully, holding on the Votrient for a week will help with his diarrhea and will help him gain a little weight.   I also told him to take a probiotic.     Volanda Napoleon, MD 11/10/20174:57 PM

## 2016-07-27 NOTE — Patient Instructions (Signed)

## 2016-07-28 LAB — PREALBUMIN: PREALBUMIN: 14 mg/dL (ref 9–32)

## 2016-08-03 ENCOUNTER — Ambulatory Visit (HOSPITAL_BASED_OUTPATIENT_CLINIC_OR_DEPARTMENT_OTHER): Payer: Medicare HMO | Admitting: Hematology & Oncology

## 2016-08-03 ENCOUNTER — Ambulatory Visit (HOSPITAL_BASED_OUTPATIENT_CLINIC_OR_DEPARTMENT_OTHER): Payer: Medicare HMO

## 2016-08-03 ENCOUNTER — Other Ambulatory Visit (HOSPITAL_BASED_OUTPATIENT_CLINIC_OR_DEPARTMENT_OTHER): Payer: Medicare HMO

## 2016-08-03 VITALS — BP 121/65 | HR 85 | Temp 97.0°F | Resp 20 | Wt 161.8 lb

## 2016-08-03 DIAGNOSIS — R109 Unspecified abdominal pain: Secondary | ICD-10-CM

## 2016-08-03 DIAGNOSIS — E860 Dehydration: Secondary | ICD-10-CM

## 2016-08-03 DIAGNOSIS — C799 Secondary malignant neoplasm of unspecified site: Secondary | ICD-10-CM

## 2016-08-03 DIAGNOSIS — D500 Iron deficiency anemia secondary to blood loss (chronic): Secondary | ICD-10-CM | POA: Diagnosis not present

## 2016-08-03 DIAGNOSIS — C641 Malignant neoplasm of right kidney, except renal pelvis: Secondary | ICD-10-CM | POA: Diagnosis not present

## 2016-08-03 LAB — CMP (CANCER CENTER ONLY)
ALK PHOS: 124 U/L — AB (ref 26–84)
ALT: 38 U/L (ref 10–47)
AST: 28 U/L (ref 11–38)
Albumin: 2.4 g/dL — ABNORMAL LOW (ref 3.3–5.5)
BUN: 23 mg/dL — AB (ref 7–22)
CO2: 30 meq/L (ref 18–33)
Calcium: 7 mg/dL — ABNORMAL LOW (ref 8.0–10.3)
Chloride: 101 mEq/L (ref 98–108)
Creat: 1.2 mg/dl (ref 0.6–1.2)
GLUCOSE: 98 mg/dL (ref 73–118)
POTASSIUM: 4.2 meq/L (ref 3.3–4.7)
Sodium: 136 mEq/L (ref 128–145)
Total Bilirubin: 1 mg/dl (ref 0.20–1.60)
Total Protein: 5.3 g/dL — ABNORMAL LOW (ref 6.4–8.1)

## 2016-08-03 LAB — CBC WITH DIFFERENTIAL (CANCER CENTER ONLY)
BASO#: 0 10*3/uL (ref 0.0–0.2)
BASO%: 0.3 % (ref 0.0–2.0)
EOS%: 4.1 % (ref 0.0–7.0)
Eosinophils Absolute: 0.5 10*3/uL (ref 0.0–0.5)
HEMATOCRIT: 35.5 % — AB (ref 38.7–49.9)
HEMOGLOBIN: 11.7 g/dL — AB (ref 13.0–17.1)
LYMPH#: 2.3 10*3/uL (ref 0.9–3.3)
LYMPH%: 19.5 % (ref 14.0–48.0)
MCH: 28.1 pg (ref 28.0–33.4)
MCHC: 33 g/dL (ref 32.0–35.9)
MCV: 85 fL (ref 82–98)
MONO#: 1.6 10*3/uL — ABNORMAL HIGH (ref 0.1–0.9)
MONO%: 13.7 % — AB (ref 0.0–13.0)
NEUT%: 62.4 % (ref 40.0–80.0)
NEUTROS ABS: 7.4 10*3/uL — AB (ref 1.5–6.5)
Platelets: 178 10*3/uL (ref 145–400)
RBC: 4.16 10*6/uL — ABNORMAL LOW (ref 4.20–5.70)
RDW: 27.1 % — AB (ref 11.1–15.7)
WBC: 11.9 10*3/uL — ABNORMAL HIGH (ref 4.0–10.0)

## 2016-08-03 LAB — LACTATE DEHYDROGENASE: LDH: 261 U/L — AB (ref 125–245)

## 2016-08-03 LAB — TECHNOLOGIST REVIEW CHCC SATELLITE

## 2016-08-03 MED ORDER — ONDANSETRON HCL 4 MG PO TABS: 4.0000 mg | ORAL_TABLET | Freq: Three times a day (TID) | ORAL | 1 refills | Status: AC | PRN

## 2016-08-03 NOTE — Progress Notes (Signed)
Hematology and Oncology Follow Up Visit  ESAM GOECKNER [id number] October 01, 1930 82 y.o. 08/03/2016   Principle Diagnosis:   Metastatic renal cell carcinoma - clear cell subtype  Iron deficiency anemia secondary to chronic blood loss  Current Therapy:    Votrient 600 mg by mouth daily - started 05/27/2016  Xgeva 120 mg subcutaneous every month  IV iron as indicated - dose given 06/18/2016     Interim History:  Mr. Benda is back for follow-up. He is feeling a lot better. We saw him last week, he was really looking bad. He was dehydrated. He has having a lot of diarrhea. I thought this was from the [place]. The diarrhea has resolved. He is still off the Votrient. His weight is up.  He has had some abdominal discomfort. This is upper abdomen. This is mostly at night time. I think he may have some reflux. I told him to try some over-the-counter antacid. I think Zegerid would be a good choice for him. His daughter came up from [place]. She wrote down everything.  He is having a lot of nausea. I will send in a prescription for Zofran.  His insurance, he would not let us do his CT scan today to see how he has responded to Votrient. We will get this done next week.  Again, he just is feeling better. I am happy about this.  I told him to routine stay off the Votrient for now.  Overall, his performance status is ECOG 2   ons:  Current Outpatient Prescriptions:  .  amLODipine (NORVASC) 10 MG tablet, Take 1 tablet (10 mg total) by mouth daily., Disp: 90 tablet, Rfl: 3 .  ibuprofen (ADVIL,MOTRIN) 200 MG tablet, Take 200 mg by mouth every 6 (six) hours as needed for pain., Disp: , Rfl:  .  Krill Oil 300 MG CAPS, Take 300 mg by mouth every Monday, Wednesday, and Friday. , Disp: , Rfl:  .  losartan (COZAAR) 100 MG tablet, Take 100 mg by mouth every morning. , Disp: , Rfl:  .  pravastatin (PRAVACHOL) 40 MG tablet, Take 40 mg by mouth every evening. , Disp: , Rfl:  .  pazopanib (VOTRIENT) 200  MG tablet, Take 3 tablets (600 mg total) by mouth daily. Take on an empty stomach. (Patient not taking: Reported on 08/03/2016), Disp: 90 tablet, Rfl: 4  Allergies: No Known Allergies  Past Medical History, Surgical history, Social history, and Family History were reviewed and updated.  Review of Systems:  as above  sical Exam:  weight is 161 lb 12.8 oz (73.4 kg). His oral temperature is 97 F (36.1 C). His blood pressure is 121/65 and his pulse is 85. His respiration is 20.   Wt Readings from Last 3 Encounters:  08/03/16 161 lb 12.8 oz (73.4 kg)  07/27/16 153 lb (69.4 kg)  06/18/16 162 lb (73.5 kg)      Thin, somewhat dehydrated appearing white male who is elderly. Head exam shows no ocular or oral lesions. There are no palpable cervical or supraclavicular lymph nodes. Lungs are clear bilaterally. Cardiac exam regular rate and rhythm with no murmurs, rubs or bruits. Abdomen is soft. He has good bowel sounds. There is no fluid wave. There is no palpable liver or spleen tip. Back exam shows no tenderness over the spine, ribs or hips. Extremities shows no clubbing, cyanosis or edema. He has symmetric muscle atrophy in his arms and legs. Skin exam shows no rashes, ecchymoses or petechia. Neurological exam shows no  focal neurological deficits.  Lab Results  Component Value Date   WBC 11.9 (H) 08/03/2016   HGB 11.7 (L) 08/03/2016   HCT 35.5 (L) 08/03/2016   MCV 85 08/03/2016   PLT 178 08/03/2016     Chemistry      Component Value Date/Time   NA 147 (H) 07/27/2016 0855   K 4.2 07/27/2016 0855   CL 105 07/27/2016 0855   CO2 28 07/27/2016 0855   BUN 22 07/27/2016 0855   CREATININE 1.2 07/27/2016 0855      Component Value Date/Time   CALCIUM 9.2 07/27/2016 0855   ALKPHOS 105 (H) 07/27/2016 0855   AST 32 07/27/2016 0855   ALT 42 07/27/2016 0855   BILITOT 1.10 07/27/2016 0855         Impression and Plan: Mr. Schatzel is a 82 year old white male. He has metastatic kidney  cancer.He has been on Votrient. I have him off Votrient right now. I want him off Votrient until he has his scans done. They will be done next Friday. I will like to see him back on Friday so that we can see how he is doing and may be re-adjust the Votrient dose.,  If we find that he actually has worsening disease, I may want to consider immunotherapy for him.  I am just glad that he is feeling better. He is eating better. Hopefully, he will have a good Thanksgiving.  I spent about 40 minutes with he and his daughter area did  I will call him Zofran for him.  Volanda Napoleon, MD 11/17/201710:36 AM

## 2016-08-06 ENCOUNTER — Ambulatory Visit: Payer: Medicare HMO | Admitting: Hematology & Oncology

## 2016-08-06 ENCOUNTER — Other Ambulatory Visit: Payer: Medicare HMO

## 2016-08-07 ENCOUNTER — Telehealth: Payer: Self-pay | Admitting: *Deleted

## 2016-08-07 ENCOUNTER — Other Ambulatory Visit: Payer: Self-pay | Admitting: *Deleted

## 2016-08-07 MED ORDER — HYDROCODONE-ACETAMINOPHEN 5-325 MG PO TABS: 1.0000 | ORAL_TABLET | ORAL | 0 refills | Status: DC | PRN

## 2016-08-07 NOTE — Telephone Encounter (Signed)
Patient c/o pain in his stomach/under his rib cage. This is not relieved with antacids or OTC pain medications. He is not sleeping due to the pain.   Spoke with Dr Marin Olp and he will provide patient with a prescription for vicoden. Patient aware of new prescription and the need for someone to pick up from office.  He will call if vicoden does not help decrease his pain.

## 2016-08-10 ENCOUNTER — Ambulatory Visit (HOSPITAL_BASED_OUTPATIENT_CLINIC_OR_DEPARTMENT_OTHER)
Admission: RE | Admit: 2016-08-10 | Discharge: 2016-08-10 | Disposition: A | Discharge status: Home / Self Care | Payer: MEDICARE | Source: Ambulatory Visit | Attending: Hematology & Oncology | Admitting: Hematology & Oncology

## 2016-08-10 ENCOUNTER — Ambulatory Visit (HOSPITAL_BASED_OUTPATIENT_CLINIC_OR_DEPARTMENT_OTHER): Payer: Medicare HMO | Admitting: Hematology & Oncology

## 2016-08-10 ENCOUNTER — Emergency Department (HOSPITAL_COMMUNITY): Payer: MEDICARE

## 2016-08-10 ENCOUNTER — Other Ambulatory Visit (HOSPITAL_BASED_OUTPATIENT_CLINIC_OR_DEPARTMENT_OTHER): Payer: Medicare HMO

## 2016-08-10 ENCOUNTER — Inpatient Hospital Stay (HOSPITAL_BASED_OUTPATIENT_CLINIC_OR_DEPARTMENT_OTHER)
Admission: EM | Admit: 2016-08-10 | Discharge: 2016-08-13 | DRG: 314 | Disposition: A | Discharge status: Home / Self Care | Payer: MEDICARE | Attending: Internal Medicine | Admitting: Internal Medicine

## 2016-08-10 ENCOUNTER — Other Ambulatory Visit: Payer: Self-pay

## 2016-08-10 ENCOUNTER — Emergency Department (HOSPITAL_BASED_OUTPATIENT_CLINIC_OR_DEPARTMENT_OTHER): Payer: MEDICARE

## 2016-08-10 ENCOUNTER — Encounter (HOSPITAL_COMMUNITY): Payer: Self-pay

## 2016-08-10 VITALS — BP 105/68 | HR 89 | Temp 97.7°F | Resp 16 | Wt 167.0 lb

## 2016-08-10 DIAGNOSIS — C799 Secondary malignant neoplasm of unspecified site: Secondary | ICD-10-CM

## 2016-08-10 DIAGNOSIS — D500 Iron deficiency anemia secondary to blood loss (chronic): Secondary | ICD-10-CM | POA: Diagnosis present

## 2016-08-10 DIAGNOSIS — E785 Hyperlipidemia, unspecified: Secondary | ICD-10-CM | POA: Diagnosis present

## 2016-08-10 DIAGNOSIS — I319 Disease of pericardium, unspecified: Secondary | ICD-10-CM

## 2016-08-10 DIAGNOSIS — I318 Other specified diseases of pericardium: Principal | ICD-10-CM | POA: Diagnosis present

## 2016-08-10 DIAGNOSIS — N179 Acute kidney failure, unspecified: Secondary | ICD-10-CM | POA: Diagnosis present

## 2016-08-10 DIAGNOSIS — I714 Abdominal aortic aneurysm, without rupture: Secondary | ICD-10-CM | POA: Diagnosis not present

## 2016-08-10 DIAGNOSIS — I493 Ventricular premature depolarization: Secondary | ICD-10-CM | POA: Diagnosis present

## 2016-08-10 DIAGNOSIS — E860 Dehydration: Secondary | ICD-10-CM

## 2016-08-10 DIAGNOSIS — K219 Gastro-esophageal reflux disease without esophagitis: Secondary | ICD-10-CM | POA: Diagnosis present

## 2016-08-10 DIAGNOSIS — C801 Malignant (primary) neoplasm, unspecified: Secondary | ICD-10-CM | POA: Diagnosis not present

## 2016-08-10 DIAGNOSIS — J90 Pleural effusion, not elsewhere classified: Secondary | ICD-10-CM

## 2016-08-10 DIAGNOSIS — E43 Unspecified severe protein-calorie malnutrition: Secondary | ICD-10-CM | POA: Diagnosis not present

## 2016-08-10 DIAGNOSIS — C787 Secondary malignant neoplasm of liver and intrahepatic bile duct: Secondary | ICD-10-CM | POA: Diagnosis not present

## 2016-08-10 DIAGNOSIS — Z96651 Presence of right artificial knee joint: Secondary | ICD-10-CM | POA: Diagnosis present

## 2016-08-10 DIAGNOSIS — C641 Malignant neoplasm of right kidney, except renal pelvis: Secondary | ICD-10-CM

## 2016-08-10 DIAGNOSIS — C7800 Secondary malignant neoplasm of unspecified lung: Secondary | ICD-10-CM | POA: Diagnosis not present

## 2016-08-10 DIAGNOSIS — I452 Bifascicular block: Secondary | ICD-10-CM | POA: Diagnosis not present

## 2016-08-10 DIAGNOSIS — Z87891 Personal history of nicotine dependence: Secondary | ICD-10-CM

## 2016-08-10 DIAGNOSIS — R0600 Dyspnea, unspecified: Secondary | ICD-10-CM | POA: Diagnosis present

## 2016-08-10 DIAGNOSIS — I358 Other nonrheumatic aortic valve disorders: Secondary | ICD-10-CM | POA: Diagnosis not present

## 2016-08-10 DIAGNOSIS — Z96659 Presence of unspecified artificial knee joint: Secondary | ICD-10-CM | POA: Diagnosis not present

## 2016-08-10 DIAGNOSIS — R64 Cachexia: Secondary | ICD-10-CM

## 2016-08-10 DIAGNOSIS — Z809 Family history of malignant neoplasm, unspecified: Secondary | ICD-10-CM

## 2016-08-10 DIAGNOSIS — I10 Essential (primary) hypertension: Secondary | ICD-10-CM | POA: Diagnosis present

## 2016-08-10 DIAGNOSIS — Z85828 Personal history of other malignant neoplasm of skin: Secondary | ICD-10-CM

## 2016-08-10 DIAGNOSIS — I313 Pericardial effusion (noninflammatory): Secondary | ICD-10-CM

## 2016-08-10 DIAGNOSIS — I3131 Malignant pericardial effusion in diseases classified elsewhere: Secondary | ICD-10-CM | POA: Diagnosis present

## 2016-08-10 DIAGNOSIS — Z811 Family history of alcohol abuse and dependence: Secondary | ICD-10-CM

## 2016-08-10 DIAGNOSIS — Z6822 Body mass index (BMI) 22.0-22.9, adult: Secondary | ICD-10-CM

## 2016-08-10 DIAGNOSIS — Z85528 Personal history of other malignant neoplasm of kidney: Secondary | ICD-10-CM | POA: Diagnosis not present

## 2016-08-10 DIAGNOSIS — I719 Aortic aneurysm of unspecified site, without rupture: Secondary | ICD-10-CM | POA: Diagnosis present

## 2016-08-10 DIAGNOSIS — Z79899 Other long term (current) drug therapy: Secondary | ICD-10-CM

## 2016-08-10 DIAGNOSIS — E784 Other hyperlipidemia: Secondary | ICD-10-CM

## 2016-08-10 DIAGNOSIS — Z9889 Other specified postprocedural states: Secondary | ICD-10-CM

## 2016-08-10 DIAGNOSIS — Z9221 Personal history of antineoplastic chemotherapy: Secondary | ICD-10-CM

## 2016-08-10 LAB — CBC WITH DIFFERENTIAL (CANCER CENTER ONLY)
BASO#: 0 10*3/uL (ref 0.0–0.2)
BASO%: 0.3 % (ref 0.0–2.0)
EOS%: 1 % (ref 0.0–7.0)
Eosinophils Absolute: 0.1 10*3/uL (ref 0.0–0.5)
HCT: 31.3 % — ABNORMAL LOW (ref 38.7–49.9)
HGB: 10.3 g/dL — ABNORMAL LOW (ref 13.0–17.1)
LYMPH#: 1.4 10*3/uL (ref 0.9–3.3)
LYMPH%: 12.6 % — AB (ref 14.0–48.0)
MCH: 28.8 pg (ref 28.0–33.4)
MCHC: 32.9 g/dL (ref 32.0–35.9)
MCV: 87 fL (ref 82–98)
MONO#: 1.1 10*3/uL — ABNORMAL HIGH (ref 0.1–0.9)
MONO%: 10.3 % (ref 0.0–13.0)
NEUT%: 75.8 % (ref 40.0–80.0)
NEUTROS ABS: 8.3 10*3/uL — AB (ref 1.5–6.5)
Platelets: 236 10*3/uL (ref 145–400)
RBC: 3.58 10*6/uL — AB (ref 4.20–5.70)
WBC: 10.9 10*3/uL — ABNORMAL HIGH (ref 4.0–10.0)

## 2016-08-10 LAB — CMP (CANCER CENTER ONLY)
ALK PHOS: 108 U/L — AB (ref 26–84)
ALT: 18 U/L (ref 10–47)
AST: 19 U/L (ref 11–38)
Albumin: 2.3 g/dL — ABNORMAL LOW (ref 3.3–5.5)
BUN: 43 mg/dL — AB (ref 7–22)
CO2: 26 mEq/L (ref 18–33)
CREATININE: 1.6 mg/dL — AB (ref 0.6–1.2)
Calcium: 6 mg/dL — CL (ref 8.0–10.3)
Chloride: 100 mEq/L (ref 98–108)
Glucose, Bld: 108 mg/dL (ref 73–118)
Potassium: 4 mEq/L (ref 3.3–4.7)
SODIUM: 139 meq/L (ref 128–145)
TOTAL PROTEIN: 5.4 g/dL — AB (ref 6.4–8.1)
Total Bilirubin: 0.7 mg/dl (ref 0.20–1.60)

## 2016-08-10 LAB — CBC
HCT: 32.3 % — ABNORMAL LOW (ref 39.0–52.0)
HEMOGLOBIN: 10.3 g/dL — AB (ref 13.0–17.0)
MCH: 27.8 pg (ref 26.0–34.0)
MCHC: 31.9 g/dL (ref 30.0–36.0)
MCV: 87.3 fL (ref 78.0–100.0)
Platelets: 238 10*3/uL (ref 150–400)
RBC: 3.7 MIL/uL — AB (ref 4.22–5.81)
RDW: 27.2 % — ABNORMAL HIGH (ref 11.5–15.5)
WBC: 11.5 10*3/uL — ABNORMAL HIGH (ref 4.0–10.5)

## 2016-08-10 LAB — BASIC METABOLIC PANEL
ANION GAP: 12 (ref 5–15)
BUN: 43 mg/dL — ABNORMAL HIGH (ref 6–20)
CALCIUM: 5.6 mg/dL — AB (ref 8.9–10.3)
CHLORIDE: 102 mmol/L (ref 101–111)
CO2: 23 mmol/L (ref 22–32)
Creatinine, Ser: 1.54 mg/dL — ABNORMAL HIGH (ref 0.61–1.24)
GFR calc non Af Amer: 40 mL/min — ABNORMAL LOW (ref >60)
GFR, EST AFRICAN AMERICAN: 46 mL/min — AB (ref >60)
Glucose, Bld: 115 mg/dL — ABNORMAL HIGH (ref 65–99)
Potassium: 3.9 mmol/L (ref 3.5–5.1)
Sodium: 137 mmol/L (ref 135–145)

## 2016-08-10 LAB — IRON AND TIBC
%SAT: 13 % — AB (ref 20–55)
Iron: 18 ug/dL — ABNORMAL LOW (ref 42–163)
TIBC: 141 ug/dL — ABNORMAL LOW (ref 202–409)
UIBC: 123 ug/dL (ref 117–376)

## 2016-08-10 LAB — I-STAT TROPONIN, ED: TROPONIN I, POC: 0.01 ng/mL (ref 0.00–0.08)

## 2016-08-10 LAB — LACTATE DEHYDROGENASE: LDH: 261 U/L — ABNORMAL HIGH (ref 125–245)

## 2016-08-10 LAB — FERRITIN

## 2016-08-10 LAB — ECHOCARDIOGRAM COMPLETE: WEIGHTICAEL: 2672 [oz_av]

## 2016-08-10 MED ORDER — SODIUM CHLORIDE 0.9% FLUSH: 3.0000 mL | INTRAVENOUS | Status: DC | PRN

## 2016-08-10 MED ORDER — ACETAMINOPHEN 650 MG RE SUPP: 650.0000 mg | Freq: Four times a day (QID) | RECTAL | Status: DC | PRN

## 2016-08-10 MED ORDER — HYDROCODONE-ACETAMINOPHEN 5-325 MG PO TABS: 1.0000 | ORAL_TABLET | ORAL | Status: DC | PRN

## 2016-08-10 MED ORDER — PRAVASTATIN SODIUM 40 MG PO TABS
40.0000 mg | ORAL_TABLET | Freq: Every evening | ORAL | Status: DC
  Administered 2016-08-11 – 2016-08-12 (×2): 40 mg via ORAL
  Filled 2016-08-10 (×2): qty 1

## 2016-08-10 MED ORDER — ACETAMINOPHEN 325 MG PO TABS: 650.0000 mg | ORAL_TABLET | Freq: Four times a day (QID) | ORAL | Status: DC | PRN

## 2016-08-10 MED ORDER — SODIUM CHLORIDE 0.9% FLUSH
3.0000 mL | Freq: Two times a day (BID) | INTRAVENOUS | Status: DC
  Administered 2016-08-11 (×2): 3 mL via INTRAVENOUS

## 2016-08-10 MED ORDER — ALBUTEROL SULFATE (2.5 MG/3ML) 0.083% IN NEBU: 2.5000 mg | INHALATION_SOLUTION | RESPIRATORY_TRACT | Status: DC | PRN

## 2016-08-10 MED ORDER — GUAIFENESIN ER 600 MG PO TB12
600.0000 mg | ORAL_TABLET | Freq: Two times a day (BID) | ORAL | Status: DC
  Administered 2016-08-11 – 2016-08-13 (×6): 600 mg via ORAL
  Filled 2016-08-10 (×6): qty 1

## 2016-08-10 MED ORDER — ONDANSETRON HCL 4 MG/2ML IJ SOLN: 4.0000 mg | Freq: Four times a day (QID) | INTRAMUSCULAR | Status: DC | PRN

## 2016-08-10 MED ORDER — IOPAMIDOL (ISOVUE-300) INJECTION 61%: 100.0000 mL | Freq: Once | INTRAVENOUS | Status: DC | PRN

## 2016-08-10 MED ORDER — ONDANSETRON HCL 4 MG PO TABS: 4.0000 mg | ORAL_TABLET | Freq: Four times a day (QID) | ORAL | Status: DC | PRN

## 2016-08-10 MED ORDER — SODIUM CHLORIDE 0.9 % IV SOLN: 250.0000 mL | INTRAVENOUS | Status: DC | PRN

## 2016-08-10 MED ORDER — POLYETHYLENE GLYCOL 3350 17 G PO PACK: 17.0000 g | PACK | Freq: Every day | ORAL | Status: DC | PRN

## 2016-08-10 MED ORDER — SODIUM CHLORIDE 0.9% FLUSH
3.0000 mL | Freq: Two times a day (BID) | INTRAVENOUS | Status: DC
  Administered 2016-08-11 – 2016-08-13 (×4): 3 mL via INTRAVENOUS

## 2016-08-10 NOTE — Consult Note (Addendum)
Reason for Consult:   Pericardial effusion  Requesting Physician: Dr Marin Olp Primary Cardiologist Dr Sallyanne Kuster  HPI:   82 y/o male followed by Dr Sallyanne Kuster with a history of HTN, HLD, AOV sclerosis, and a small AAA. Recently he was diagnosed with renal cell cancer with metastasis to his lungs.  He started chemotherapy 05/27/16 with plans to continue this for 3 months. A CT today unfortunately showed marked progression of his cancer now with liver mets and a large pericardial effusion. He has been having dyspnea and orthopnea. He was seen by Dr Marin Olp today and sent to the hospital for further evaluation and treatment. In the ED he is comfortable at rest and has a systolic B/P of [id number]. A STAT echo was performed to evaluate for tamponade physiology and I personally reviewed and interpreted the study.  PMHx:  Past Medical History:  Diagnosis Date  . Aortic aneurysm ([place])   . Aortic aneurysm ([facility])    "JUST UNDER 2 CM"  . Arthritis    RT WRIST  . Basal cell carcinoma   . Gallstones   . GERD (gastroesophageal reflux disease)    OCCASIONAL  . Hyperlipidemia   . Hypertension   . Iron deficiency anemia due to chronic blood loss 05/25/2016  . Primary cancer of right kidney with metastasis from kidney to other site [facility]) 05/25/2016  . Shortness of breath    WITH EXERTION  . Slow heart rate    "has been down to 45 in the past"  . Tinnitus   . Weak urinary stream     Past Surgical History:  Procedure Laterality Date  . ANKLE SURGERY Left 1975  . CHOLECYSTECTOMY N/A 04/08/2013   Procedure: LAPAROSCOPIC CHOLECYSTECTOMY WITH INTRAOPERATIVE CHOLANGIOGRAM;  Surgeon: Imogene Burn. Georgette Dover, MD;  Location: [facility];  Service: General;  Laterality: N/A;  . COLONOSCOPY    . INGUINAL HERNIA REPAIR Bilateral 08/06/2013   Procedure: HERNIA REPAIR INGUINAL ADULT BILATERAL;  Surgeon: Imogene Burn. Georgette Dover, MD;  Location: [facility];  Service: General;  Laterality: Bilateral;  .  LAPAROSCOPIC APPENDECTOMY N/A 04/08/2013   Procedure: LAPAROSCOPIC PARTIAL CECECTOMY;  Surgeon: Imogene Burn. Georgette Dover, MD;  Location: [facility];  Service: General;  Laterality: N/A;  . TONSILLECTOMY    . TOTAL KNEE ARTHROPLASTY  2007   RT  . VASECTOMY      SOCHx:  reports that he quit smoking about 53 years ago. He has never used smokeless tobacco. He reports that he drinks alcohol. He reports that he does not use drugs.  FAMHx: Family History  Problem Relation Age of Onset  . Cancer Mother   . Alcohol abuse Father     ALLERGIES: No Known Allergies  ROS: Review of Systems: General: negative for chills, fever, night sweats or weight changes.  Cardiovascular: negative for chest pain, orthopnea, palpitations HEENT: negative for any visual disturbances, blindness, glaucoma Dermatological: negative for rash Respiratory: negative for cough, hemoptysis, or wheezing Urologic: negative for hematuria or dysuria Abdominal: negative for nausea, vomiting, diarrhea, bright red blood per rectum, melena, or hematemesis Neurologic: negative for visual changes, syncope, or dizziness Musculoskeletal: negative for back pain, joint pain, or swelling Psych: cooperative and appropriate All other systems reviewed and are otherwise negative except as noted above.   HOME MEDICATIONS: Prior to Admission medications   Medication Sig Start Date End Date Taking? Authorizing Provider  amLODipine (NORVASC) 10 MG tablet Take 1 tablet (10 mg total) by mouth daily. 11/07/15  Mihai Croitoru, MD  HYDROcodone-acetaminophen (NORCO) 5-325 MG tablet Take 1 tablet by mouth every 4 (four) hours as needed for moderate pain. 08/07/16   Volanda Napoleon, MD  ibuprofen (ADVIL,MOTRIN) 200 MG tablet Take 200 mg by mouth every 6 (six) hours as needed for pain.    Historical Provider, MD  Astrid Drafts 300 MG CAPS Take 300 mg by mouth every Monday, Wednesday, and Friday.     Historical Provider, MD  losartan (COZAAR) 100 MG tablet Take  100 mg by mouth every morning.     Historical Provider, MD  ondansetron (ZOFRAN) 4 MG tablet Take 1 tablet (4 mg total) by mouth every 8 (eight) hours as needed for nausea or vomiting. 08/03/16   Volanda Napoleon, MD  pazopanib (VOTRIENT) 200 MG tablet Take 3 tablets (600 mg total) by mouth daily. Take on an empty stomach. 05/25/16   Volanda Napoleon, MD  pravastatin (PRAVACHOL) 40 MG tablet Take 40 mg by mouth every evening.     Historical Provider, MD    HOSPITAL MEDICATIONS: I have reviewed the patient's current medications.  VITALS: Blood pressure 115/70, pulse 92, temperature 98 F (36.7 C), temperature source Oral, resp. rate (!) 0, SpO2 96 %.  PHYSICAL EXAM: General appearance: alert, cachectic and no distress Neck: mild anterior cervical adenopathy, no carotid bruit and no JVD Lungs: diminished breath sounds bilaterally, dullness to percussion RLL and rales RLL Heart: regular rate and rhythm Abdomen: soft, non-tender; bowel sounds normal; no masses,  no organomegaly Extremities: edema 1+ bilateral edema Pulses: 2+ and symmetric Skin: pale, cool, dry Neurologic: Mental status: Alert, oriented, thought content appropriate Psych: Pleasant  LABS: Results for orders placed or performed during the hospital encounter of 08/10/16 (from the past 24 hour(s))  Basic metabolic panel     Status: Abnormal   Collection Time: 08/10/16  3:15 PM  Result Value Ref Range   Sodium 137 135 - 145 mmol/L   Potassium 3.9 3.5 - 5.1 mmol/L   Chloride 102 101 - 111 mmol/L   CO2 23 22 - 32 mmol/L   Glucose, Bld 115 (H) 65 - 99 mg/dL   BUN 43 (H) 6 - 20 mg/dL   Creatinine, Ser 1.54 (H) 0.61 - 1.24 mg/dL   Calcium 5.6 (LL) 8.9 - 10.3 mg/dL   GFR calc non Af Amer 40 (L) >60 mL/min   GFR calc Af Amer 46 (L) >60 mL/min   Anion gap 12 5 - 15  CBC     Status: Abnormal   Collection Time: 08/10/16  3:15 PM  Result Value Ref Range   WBC 11.5 (H) 4.0 - 10.5 K/uL   RBC 3.70 (L) 4.22 - 5.81 MIL/uL    Hemoglobin 10.3 (L) 13.0 - 17.0 g/dL   HCT 32.3 (L) 39.0 - 52.0 %   MCV 87.3 78.0 - 100.0 fL   MCH 27.8 26.0 - 34.0 pg   MCHC 31.9 30.0 - 36.0 g/dL   RDW 27.2 (H) 11.5 - 15.5 %   Platelets 238 150 - 400 K/uL  I-stat troponin, ED     Status: None   Collection Time: 08/10/16  3:26 PM  Result Value Ref Range   Troponin i, poc 0.01 0.00 - 0.08 ng/mL   Comment 3            EKG: NSR, RBBB  IMAGING: Dg Chest 2 View  Result Date: 08/10/2016 CLINICAL DATA:  Fluid in the lungs EXAM: CHEST  2 VIEW COMPARISON:  CT scan  of the chest same day FINDINGS: There is small right pleural effusion. Atelectasis or infiltrate is noted in right base. Tiny left pleural effusion. Bilateral pulmonary nodules are noted consistent with metastatic disease. No pulmonary edema. IMPRESSION: Small right pleural effusion. Atelectasis or infiltrate right base. Bilateral pulmonary nodules. Tiny left pleural effusion. No pulmonary edema. Electronically Signed   By: Lahoma Crocker M.D.   On: 08/10/2016 16:00   Ct Chest W Contrast  Result Date: 08/10/2016 CLINICAL DATA:  Metastatic right renal cell carcinoma, restaging EXAM: CT CHEST, ABDOMEN, AND PELVIS WITH CONTRAST TECHNIQUE: Multidetector CT imaging of the chest, abdomen and pelvis was performed following the standard protocol during bolus administration of intravenous contrast. CONTRAST:  100 cc Isovue 300 COMPARISON:  Multiple exams, including 05/08/2016 and 04/24/2016 FINDINGS: CT CHEST FINDINGS Cardiovascular: Coronary, aortic arch, and branch vessel atherosclerotic vascular disease. Ascending aortic aneurysm, 4.1 cm diameter. Large pericardial effusion, previously small. Calcified aortic and mitral valves. Mediastinum/Nodes: Left supraclavicular node 2.7 cm in short axis, formerly 1.4 cm. Newly pathologic prevascular, left internal mammary, lower thoracic periaortic, and likely left axillary adenopathy, representative upper mediastinal node 1.7 cm in short axis on image 12/2  (formerly 0.6 cm) and representative left internal mammary node 1.0 cm in short axis on image 23/2 (formerly 0.3 cm). An upper paraesophageal lymph node is 0.8 cm in short axis on image 17/2, formerly not seen. Just above the hiatus a lymph node adjacent to the thoracic aorta measures 2.8 cm in short axis, previously 0.9 cm. Lungs/Pleura: Large right and moderate left pleural effusions, with extensive tumor along the right hemidiaphragm posteriorly extending into the pleural space, and questionably invading through the diaphragm and partially into the perirenal space on the right. Scattered metastatic lesions are present in the aerated portion of the lung, a representative nodule of the right middle lobe measuring 2.8 by 2.6 cm, formerly 0.7 by 0.7 cm. Other nodules are likewise enlarged. Musculoskeletal: Thoracic spondylosis. As noted above, tumor along the right hemidiaphragm could be invading across the diaphragm. CT ABDOMEN PELVIS FINDINGS Hepatobiliary: There variety of hepatic cysts as well as new metastatic foci in the liver. A 2.0 by 1.6 cm metastatic lesion posteriorly in the right hepatic lobe is present on image 68/2. Moreover, the aid primary right kidney upper pole mass appears to be invading the liver on image 65/2. No portal vein thrombosis or visible hepatic vein thrombosis. No current biliary obstruction is identified although there is extensive tumor in the porta hepatis encasing the extrahepatic biliary tree. Pancreas: Porta hepatis tumor is tangential to the pancreatic body and head but not obviously invading the pancreas. No dorsal pancreatic duct dilatation. Spleen: There potential deposits of tumor in the ascites surrounding the spleen but no metastatic lesion inside the parenchyma of the spleen is seen. Adrenals/Urinary Tract: The right kidney upper pole mass measures 8.3 by 6.2 cm (formerly 5.4 by 5.4 cm) and is invading the liver. There are numerous new deposits of tumor along the right  perirenal space and along the right adrenal gland. The right adrenal tumor is somewhat indistinct but the main conglomerate adrenal mass measures 3.2 by 3.8 cm on image 63/2 and is new. Stomach/Bowel: The stomach body has a somewhat narrowed lumen. There are innumerable deposits of tumor in the mesentery around the stomach in some involvement of the gastric wall is not excluded given this unusual appearance. Mildly dilated loops of proximal small bowel are observed for example in the jejunum on image [id number], cause uncertain. Vascular/Lymphatic:  Massive gastrohepatic ligament, porta hepatis, and retroperitoneal adenopathy, markedly worsened from prior and confluent today. Encasement of the celiac artery branches and portal vein bite confluent adenopathy, without definite tumor thrombus or thrombosis. The IVC is narrowed by surrounding tumor and could possibly be invaded. Conglomerate left para-aortic adenopathy at the level of left renal vein measures 4.0 by 4.2 cm, formerly 1.4 by 2.4 cm. Infrarenal abdominal aortic aneurysm, fusiform, 3.8 cm in diameter. Reproductive: Mildly enlarged prostate gland. Other: Scattered ascites with innumerable deposits of tumor within the ascites, omentum, and mesentery. Diffuse mesenteric edema and mild subcutaneous edema. Musculoskeletal: Although I do not see direct bony involvement of tumor, there is some tumor extending into the thoracoabdominal wall in the right posterior eleventh intercostal space, image 69/2. This is along the region of the diaphragmatic tumor. IMPRESSION: 1. Markedly severe progression of malignancy burden, with extensive increased in adenopathy in the lower neck, chest, and abdomen; a large malignant right pleural effusion with tumor invading the diaphragm and extending into the right perirenal space; increase in size and number of pulmonary metastatic lesions; enlargement of the primary right kidney upper pole lesion with hepatic metastatic lesions and direct  invasion of the liver by the tumor ; and extensive peritoneal, mesenteric, and omental studding of tumor. 2. There is a large pericardial effusion which could possibly cause tamponade. Cannot exclude malignant effusion. 3. Moderate left pleural effusion. 4. Small aneurysms of the ascending thoracic aorta and infrarenal abdominal aorta normally would merit follow up but are minor in comparison to the neoplastic findings. 5. Coronary, aortic arch, and branch vessel atherosclerotic vascular disease. Electronically Signed   By: Van Clines M.D.   On: 08/10/2016 09:13   Ct Abdomen Pelvis W Contrast  Result Date: 08/10/2016 CLINICAL DATA:  Metastatic right renal cell carcinoma, restaging EXAM: CT CHEST, ABDOMEN, AND PELVIS WITH CONTRAST TECHNIQUE: Multidetector CT imaging of the chest, abdomen and pelvis was performed following the standard protocol during bolus administration of intravenous contrast. CONTRAST:  100 cc Isovue 300 COMPARISON:  Multiple exams, including 05/08/2016 and 04/24/2016 FINDINGS: CT CHEST FINDINGS Cardiovascular: Coronary, aortic arch, and branch vessel atherosclerotic vascular disease. Ascending aortic aneurysm, 4.1 cm diameter. Large pericardial effusion, previously small. Calcified aortic and mitral valves. Mediastinum/Nodes: Left supraclavicular node 2.7 cm in short axis, formerly 1.4 cm. Newly pathologic prevascular, left internal mammary, lower thoracic periaortic, and likely left axillary adenopathy, representative upper mediastinal node 1.7 cm in short axis on image 12/2 (formerly 0.6 cm) and representative left internal mammary node 1.0 cm in short axis on image 23/2 (formerly 0.3 cm). An upper paraesophageal lymph node is 0.8 cm in short axis on image 17/2, formerly not seen. Just above the hiatus a lymph node adjacent to the thoracic aorta measures 2.8 cm in short axis, previously 0.9 cm. Lungs/Pleura: Large right and moderate left pleural effusions, with extensive tumor  along the right hemidiaphragm posteriorly extending into the pleural space, and questionably invading through the diaphragm and partially into the perirenal space on the right. Scattered metastatic lesions are present in the aerated portion of the lung, a representative nodule of the right middle lobe measuring 2.8 by 2.6 cm, formerly 0.7 by 0.7 cm. Other nodules are likewise enlarged. Musculoskeletal: Thoracic spondylosis. As noted above, tumor along the right hemidiaphragm could be invading across the diaphragm. CT ABDOMEN PELVIS FINDINGS Hepatobiliary: There variety of hepatic cysts as well as new metastatic foci in the liver. A 2.0 by 1.6 cm metastatic lesion posteriorly in the right hepatic  lobe is present on image 68/2. Moreover, the aid primary right kidney upper pole mass appears to be invading the liver on image 65/2. No portal vein thrombosis or visible hepatic vein thrombosis. No current biliary obstruction is identified although there is extensive tumor in the porta hepatis encasing the extrahepatic biliary tree. Pancreas: Porta hepatis tumor is tangential to the pancreatic body and head but not obviously invading the pancreas. No dorsal pancreatic duct dilatation. Spleen: There potential deposits of tumor in the ascites surrounding the spleen but no metastatic lesion inside the parenchyma of the spleen is seen. Adrenals/Urinary Tract: The right kidney upper pole mass measures 8.3 by 6.2 cm (formerly 5.4 by 5.4 cm) and is invading the liver. There are numerous new deposits of tumor along the right perirenal space and along the right adrenal gland. The right adrenal tumor is somewhat indistinct but the main conglomerate adrenal mass measures 3.2 by 3.8 cm on image 63/2 and is new. Stomach/Bowel: The stomach body has a somewhat narrowed lumen. There are innumerable deposits of tumor in the mesentery around the stomach in some involvement of the gastric wall is not excluded given this unusual appearance.  Mildly dilated loops of proximal small bowel are observed for example in the jejunum on image [id number], cause uncertain. Vascular/Lymphatic: Massive gastrohepatic ligament, porta hepatis, and retroperitoneal adenopathy, markedly worsened from prior and confluent today. Encasement of the celiac artery branches and portal vein bite confluent adenopathy, without definite tumor thrombus or thrombosis. The IVC is narrowed by surrounding tumor and could possibly be invaded. Conglomerate left para-aortic adenopathy at the level of left renal vein measures 4.0 by 4.2 cm, formerly 1.4 by 2.4 cm. Infrarenal abdominal aortic aneurysm, fusiform, 3.8 cm in diameter. Reproductive: Mildly enlarged prostate gland. Other: Scattered ascites with innumerable deposits of tumor within the ascites, omentum, and mesentery. Diffuse mesenteric edema and mild subcutaneous edema. Musculoskeletal: Although I do not see direct bony involvement of tumor, there is some tumor extending into the thoracoabdominal wall in the right posterior eleventh intercostal space, image 69/2. This is along the region of the diaphragmatic tumor. IMPRESSION: 1. Markedly severe progression of malignancy burden, with extensive increased in adenopathy in the lower neck, chest, and abdomen; a large malignant right pleural effusion with tumor invading the diaphragm and extending into the right perirenal space; increase in size and number of pulmonary metastatic lesions; enlargement of the primary right kidney upper pole lesion with hepatic metastatic lesions and direct invasion of the liver by the tumor ; and extensive peritoneal, mesenteric, and omental studding of tumor. 2. There is a large pericardial effusion which could possibly cause tamponade. Cannot exclude malignant effusion. 3. Moderate left pleural effusion. 4. Small aneurysms of the ascending thoracic aorta and infrarenal abdominal aorta normally would merit follow up but are minor in comparison to the  neoplastic findings. 5. Coronary, aortic arch, and branch vessel atherosclerotic vascular disease. Electronically Signed   By: Van Clines M.D.   On: 08/10/2016 09:13    IMPRESSION: Principal Problem:   Malignant pericardial effusion ([facility]) Active Problems:   Dyspnea   Aortic aneurysm ([facility])   HTN (hypertension)   Hyperlipidemia   Primary cancer of right kidney with metastasis from kidney to other site [facility])   RBBB with left anterior fascicular block   Asymptomatic PVCs   Aortic valve sclerosis   RECOMMENDATION: 1. Mr. Vilchez unfortately has widely metastatic cancer preseumed to be his renal cell CA. This is "significantly worse" in the last few months since  diagnosis, despite chemotherapy. Dr. Marin Olp belives the prognosis is poor with little additional chemotherapy options. He still wishes to be FULL CODE for now but does have an advanced directive to not be in a persistent vegetative state. His pericardial effusion is moderate with some early tamponade features, but nothing that necessitates emergent pericardiocentesis - this fits his clinical picture. He may benefit from some diuresis and a therapeutic thoracentesis of his pleural effusion. Based on the overall clinical picture, however, it would be reasonable to take a palliative approach.  Thanks for the consultation. Cardiology will follow with you.  Time Spent Directly with Patient: 40 minutes  Pixie Casino, MD, [facility] Attending Cardiologist [facility]  08/10/2016, 8:18 PM

## 2016-08-10 NOTE — Progress Notes (Signed)
  Echocardiogram 2D Echocardiogram has been performed.  Tresa Res 08/10/2016, 9:16 PM

## 2016-08-10 NOTE — ED Notes (Signed)
ED Provider at bedside. 

## 2016-08-10 NOTE — Progress Notes (Signed)
Hematology and Oncology Follow Up Visit  SANI GOLDSBERRY TU:[id number] 1931-03-31 82 y.o. 08/10/2016   Principle Diagnosis:   Metastatic renal cell carcinoma - clear cell subtype  Iron deficiency anemia secondary to chronic blood loss  Current Therapy:    Votrient 600 mg by mouth daily - started 05/27/2016  Xgeva 120 mg subcutaneous every month  IV iron as indicated - dose given 06/18/2016     Interim History:  Mr. Ringenberg is back for follow-up. Unfortunately, his CAT scan that he had done today looks markedly worse. I am incredibly surprised by this. I saw his scans. I reviewed them with his daughter and he. His scans show marked progression with new right pleural effusion. He has a new large pericardial effusion. He has worsening adenopathy in the chest and in the upper abdomen. His right renal mass is larger and invading the liver. 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 I don't think there is any obvious ascites.  He does state that he feels a little bit better. His weight on up a little bit.  He might be eating a little better. Is having more pain area and while he is in the hospital, which is where he will be admitted to today, his pain control will be worked on.  He says he still has a hard time lying down flat. I'm sure this is from the pleural or pericardial effusions.  He is not bleeding. He's had no rashes. He's had no headache.  There has been no hemoptysis. He's had no melena or bright red blood per rectum.  Overall, his performance status is ECOG 2   ons:  Current Outpatient Prescriptions:  .  amLODipine (NORVASC) 10 MG tablet, Take 1 tablet (10 mg total) by mouth daily., Disp: 90 tablet, Rfl: 3 .  HYDROcodone-acetaminophen (NORCO) 5-325 MG tablet, Take 1 tablet by mouth every 4 (four) hours as needed for moderate pain., Disp: 60 tablet, Rfl: 0 .  ibuprofen (ADVIL,MOTRIN) 200 MG tablet, Take 200 mg by mouth every 6 (six) hours as needed for pain., Disp:  , Rfl:  .  Krill Oil 300 MG CAPS, Take 300 mg by mouth every Monday, Wednesday, and Friday. , Disp: , Rfl:  .  losartan (COZAAR) 100 MG tablet, Take 100 mg by mouth every morning. , Disp: , Rfl:  .  ondansetron (ZOFRAN) 4 MG tablet, Take 1 tablet (4 mg total) by mouth every 8 (eight) hours as needed for nausea or vomiting., Disp: 30 tablet, Rfl: 1 .  pazopanib (VOTRIENT) 200 MG tablet, Take 3 tablets (600 mg total) by mouth daily. Take on an empty stomach., Disp: 90 tablet, Rfl: 4 .  pravastatin (PRAVACHOL) 40 MG tablet, Take 40 mg by mouth every evening. , Disp: , Rfl:  No current facility-administered medications for this visit.   Facility-Administered Medications Ordered in Other Visits:  .  iopamidol (ISOVUE-300) 61 % injection 100 mL, 100 mL, Intravenous, Once PRN, Volanda Napoleon, MD  Allergies: No Known Allergies  Past Medical History, Surgical history, Social history, and Family History were reviewed and updated.  Review of Systems:  as above  sical Exam:  weight is 167 lb (75.8 kg). His oral temperature is 97.7 F (36.5 C). His blood pressure is 105/68 and his pulse is 89. His respiration is 16.   Wt Readings from Last 3 Encounters:  08/10/16 167 lb (75.8 kg)  08/03/16 161 lb 12.8 oz (73.4 kg)  07/27/16 153 lb (69.4 kg)  Thin, somewhat dehydrated appearing white male who is elderly. Head exam shows no ocular or oral lesions. There are no palpable cervical or supraclavicular lymph nodes. Lungs are clear bilaterally. Cardiac exam regular rate and rhythm with no murmurs, rubs or bruits. Abdomen is soft. He has good bowel sounds. There is no fluid wave. There is no palpable liver or spleen tip. Back exam shows no tenderness over the spine, ribs or hips. Extremities shows no clubbing, cyanosis or edema. He has symmetric muscle atrophy in his arms and legs. Skin exam shows no rashes, ecchymoses or petechia. Neurological exam shows no focal neurological deficits.  Lab Results    Component Value Date   WBC 10.9 (H) 08/10/2016   HGB 10.3 (L) 08/10/2016   HCT 31.3 (L) 08/10/2016   MCV 87 08/10/2016   PLT 236 08/10/2016     Chemistry      Component Value Date/Time   NA 139 08/10/2016 1058   K 4.0 08/10/2016 1058   CL 100 08/10/2016 1058   CO2 26 08/10/2016 1058   BUN 43 (H) 08/10/2016 1058   CREATININE 1.6 (H) 08/10/2016 1058      Component Value Date/Time   CALCIUM 6.0 (LL) 08/10/2016 1058   ALKPHOS 108 (H) 08/10/2016 1058   AST 19 08/10/2016 1058   ALT 18 08/10/2016 1058   BILITOT 0.70 08/10/2016 1058         Impression and Plan: Mr. Douglas is a 82 year old white male. He has metastatic kidney cancer.He has been on Votrient. I have him off Votrient right now. His diarrhea is much better.  Unfortunately, his CT scan is markedly worse. He now has a large pericardial effusion. He has a large right oral effusion. He has extensive adenopathy in the upper abdomen. He has new mediastinal adenopathy. He has an enlarged left supraclavicular lymph node. His right renal mass is larger and invading the liver. 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  It is clear that the Votrient has not done much of anything.  Our options are limited for my point of view. We will consider immunotherapy for him with Nivolumab. I think this might be reasonable.  I showed he and his daughter the CT scan. Again this is quite a marked difference. It is apparent that his tumor is quite virulent. It is apparent of the biology of his tumor is one that is quite aggressive.  I did talk to him about end-of-life issues. I talked to him about being kept alive on a machine. I talked him about being kept alive with CPR, ventilators, etc. He does not want be kept alive on a machine. I totally agree with this.   I told him that if he did not wish to have any further therapy or if therapy did not work, then I think his prognosis is probably going to be 4-6  weeks. He and his daughter understand this.  I really feel bad for him. He has done everything that we have asked him to do.  Skin forcefully is going to have to be admitted. He's going a paracardial centesis. He will need a thoracentesis. We can get more samples for pathology. I could not imagine that he has anything different than the renal cell cancer.  I spent over an hour with him today. This obviously is very complicated. We had to do a lot of additional work to get things set up for him to be admitted. We will see  him when he is over at the hospital.   I will call him Zofran for him.  Volanda Napoleon, MD 11/24/20171:09 PM

## 2016-08-10 NOTE — ED Provider Notes (Signed)
[facility] DEPT Provider Note   CSN: [id number] Arrival date & time: 08/10/16  1458     History   Chief Complaint Chief Complaint  Patient presents with  . Shortness of Breath    HPI Bill Foley is a 82 y.o. male.  The history is provided by the patient. No language interpreter was used.  Shortness of Breath     Bill Foley is a 82 y.o. male who presents to the Emergency Department complaining of SOB.  He has a history of metastatic renal cell carcinoma and presents from his oncology office for abnormal CT scan today. He reports 2 months of progressive shortness of breath with 3 weeks of orthopnea and chest pain while laying supine. He has progressive lower extremity edema. He had a staging CT chest abdomen pelvis performed today that demonstrated a right-sided pleural effusion as well as a large pericardial effusion with concern for tamponade. He was referred to the emergency department by his oncologist for admission for pericardiocentesis and thoracentesis. He denies any fevers, cough, abdominal pain, vomiting. He does have ongoing diarrhea that is unchanged from baseline.  Past Medical History:  Diagnosis Date  . Aortic aneurysm ([place])   . Aortic aneurysm ([facility])    "JUST UNDER 2 CM"  . Arthritis    RT WRIST  . Basal cell carcinoma   . Gallstones   . GERD (gastroesophageal reflux disease)    OCCASIONAL  . Hyperlipidemia   . Hypertension   . Iron deficiency anemia due to chronic blood loss 05/25/2016  . Primary cancer of right kidney with metastasis from kidney to other site [facility]) 05/25/2016  . Shortness of breath    WITH EXERTION  . Slow heart rate    "has been down to 45 in the past"  . Tinnitus   . Weak urinary stream     Patient Active Problem List   Diagnosis Date Noted  . Primary cancer of right kidney with metastasis from kidney to other site [facility]) 05/25/2016  . Iron deficiency anemia due to chronic blood loss 05/25/2016  . Renal mass, right  04/27/2016  . Unexplained night sweats 04/03/2016  . Abdominal wall hernia 04/30/2014  . Localized osteoarthritis of left knee 04/30/2014  . Body mass index 25.0-25.9, adult 10/16/2013  . Disorder of skin and subcutaneous tissue 10/16/2013  . Personal history of tobacco use, presenting hazards to health 10/16/2013  . Routine history and physical examination of adult 10/16/2013  . Screening for prostate cancer 10/16/2013  . Abnormal stress electrocardiogram test using treadmill 06/16/2013  . Dyspnea 05/12/2013  . Aortic aneurysm ([place]) 05/12/2013  . HTN (hypertension) 05/12/2013  . Hyperlipidemia 05/12/2013  . Chronic cholecystitis with calculus 03/10/2013  . Adenomatous polyp of colon 03/10/2013  . Bilateral inguinal hernia 03/10/2013  . Arthropathy 10/13/2012  . Malignant neoplasm of skin 10/13/2012  . Screening for glaucoma 10/13/2012  . Tinnitus 10/13/2012    Past Surgical History:  Procedure Laterality Date  . ANKLE SURGERY Left 1975  . CHOLECYSTECTOMY N/A 04/08/2013   Procedure: LAPAROSCOPIC CHOLECYSTECTOMY WITH INTRAOPERATIVE CHOLANGIOGRAM;  Surgeon: Imogene Burn. Georgette Dover, MD;  Location: [facility];  Service: General;  Laterality: N/A;  . COLONOSCOPY    . INGUINAL HERNIA REPAIR Bilateral 08/06/2013   Procedure: HERNIA REPAIR INGUINAL ADULT BILATERAL;  Surgeon: Imogene Burn. Georgette Dover, MD;  Location: [facility];  Service: General;  Laterality: Bilateral;  . LAPAROSCOPIC APPENDECTOMY N/A 04/08/2013   Procedure: LAPAROSCOPIC PARTIAL CECECTOMY;  Surgeon: Imogene Burn. Tsuei, MD;  Location: [facility];  Service: General;  Laterality: N/A;  . TONSILLECTOMY    . TOTAL KNEE ARTHROPLASTY  2007   RT  . VASECTOMY         Home Medications    Prior to Admission medications   Medication Sig Start Date End Date Taking? Authorizing Provider  amLODipine (NORVASC) 10 MG tablet Take 1 tablet (10 mg total) by mouth daily. 11/07/15   Mihai Croitoru, MD  HYDROcodone-acetaminophen (NORCO) 5-325 MG  tablet Take 1 tablet by mouth every 4 (four) hours as needed for moderate pain. 08/07/16   Volanda Napoleon, MD  ibuprofen (ADVIL,MOTRIN) 200 MG tablet Take 200 mg by mouth every 6 (six) hours as needed for pain.    Historical Provider, MD  Astrid Drafts 300 MG CAPS Take 300 mg by mouth every Monday, Wednesday, and Friday.     Historical Provider, MD  losartan (COZAAR) 100 MG tablet Take 100 mg by mouth every morning.     Historical Provider, MD  ondansetron (ZOFRAN) 4 MG tablet Take 1 tablet (4 mg total) by mouth every 8 (eight) hours as needed for nausea or vomiting. 08/03/16   Volanda Napoleon, MD  pazopanib (VOTRIENT) 200 MG tablet Take 3 tablets (600 mg total) by mouth daily. Take on an empty stomach. 05/25/16   Volanda Napoleon, MD  pravastatin (PRAVACHOL) 40 MG tablet Take 40 mg by mouth every evening.     Historical Provider, MD    Family History Family History  Problem Relation Age of Onset  . Cancer Mother   . Alcohol abuse Father     Social History Social History  Substance Use Topics  . Smoking status: Former Smoker    Quit date: 03/11/1963  . Smokeless tobacco: Never Used  . Alcohol use 0.0 oz/week    5 - 7 Glasses of wine per week     Allergies   Patient has no known allergies.   Review of Systems Review of Systems  Respiratory: Positive for shortness of breath.   All other systems reviewed and are negative.    Physical Exam Updated Vital Signs BP 115/70   Pulse 92   Temp 98 F (36.7 C) (Oral)   Resp (!) 0   SpO2 96%   Physical Exam  Constitutional: He is oriented to person, place, and time. He appears well-developed.  Thin  HENT:  Head: Normocephalic and atraumatic.  Cardiovascular: Normal rate and regular rhythm.   No murmur heard. Pulmonary/Chest: Effort normal. No respiratory distress.  Decreased air movement in bilateral bases  Abdominal: Soft. There is no tenderness. There is no rebound and no guarding.  Musculoskeletal: He exhibits no tenderness.    1+ edema bilateral lower extremities  Neurological: He is alert and oriented to person, place, and time.  Skin: Skin is warm and dry.  Psychiatric: He has a normal mood and affect. His behavior is normal.  Nursing note and vitals reviewed.    ED Treatments / Results  Labs (all labs ordered are listed, but only abnormal results are displayed) Labs Reviewed  BASIC METABOLIC PANEL - Abnormal; Notable for the following:       Result Value   Glucose, Bld 115 (*)    BUN 43 (*)    Creatinine, Ser 1.54 (*)    Calcium 5.6 (*)    GFR calc non Af Amer 40 (*)    GFR calc Af Amer 46 (*)    All other components within normal limits  CBC -  Abnormal; Notable for the following:    WBC 11.5 (*)    RBC 3.70 (*)    Hemoglobin 10.3 (*)    HCT 32.3 (*)    RDW 27.2 (*)    All other components within normal limits  I-STAT TROPOININ, ED    EKG  EKG Interpretation  Date/Time:  Friday August 10 2016 15:07:05 EST Ventricular Rate:  98 PR Interval:    QRS Duration: 144 QT Interval:  382 QTC Calculation: 487 R Axis:   -34 Text Interpretation:  Sinus rhythm with short PR Left axis deviation Right bundle branch block Inferior infarct , age undetermined Abnormal ECG Confirmed by Hazle Coca [phone]) on 08/10/2016 7:18:52 PM       Radiology Dg Chest 2 View  Result Date: 08/10/2016 CLINICAL DATA:  Fluid in the lungs EXAM: CHEST  2 VIEW COMPARISON:  CT scan of the chest same day FINDINGS: There is small right pleural effusion. Atelectasis or infiltrate is noted in right base. Tiny left pleural effusion. Bilateral pulmonary nodules are noted consistent with metastatic disease. No pulmonary edema. IMPRESSION: Small right pleural effusion. Atelectasis or infiltrate right base. Bilateral pulmonary nodules. Tiny left pleural effusion. No pulmonary edema. Electronically Signed   By: Lahoma Crocker M.D.   On: 08/10/2016 16:00   Ct Chest W Contrast  Result Date: 08/10/2016 CLINICAL DATA:  Metastatic right  renal cell carcinoma, restaging EXAM: CT CHEST, ABDOMEN, AND PELVIS WITH CONTRAST TECHNIQUE: Multidetector CT imaging of the chest, abdomen and pelvis was performed following the standard protocol during bolus administration of intravenous contrast. CONTRAST:  100 cc Isovue 300 COMPARISON:  Multiple exams, including 05/08/2016 and 04/24/2016 FINDINGS: CT CHEST FINDINGS Cardiovascular: Coronary, aortic arch, and branch vessel atherosclerotic vascular disease. Ascending aortic aneurysm, 4.1 cm diameter. Large pericardial effusion, previously small. Calcified aortic and mitral valves. Mediastinum/Nodes: Left supraclavicular node 2.7 cm in short axis, formerly 1.4 cm. Newly pathologic prevascular, left internal mammary, lower thoracic periaortic, and likely left axillary adenopathy, representative upper mediastinal node 1.7 cm in short axis on image 12/2 (formerly 0.6 cm) and representative left internal mammary node 1.0 cm in short axis on image 23/2 (formerly 0.3 cm). An upper paraesophageal lymph node is 0.8 cm in short axis on image 17/2, formerly not seen. Just above the hiatus a lymph node adjacent to the thoracic aorta measures 2.8 cm in short axis, previously 0.9 cm. Lungs/Pleura: Large right and moderate left pleural effusions, with extensive tumor along the right hemidiaphragm posteriorly extending into the pleural space, and questionably invading through the diaphragm and partially into the perirenal space on the right. Scattered metastatic lesions are present in the aerated portion of the lung, a representative nodule of the right middle lobe measuring 2.8 by 2.6 cm, formerly 0.7 by 0.7 cm. Other nodules are likewise enlarged. Musculoskeletal: Thoracic spondylosis. As noted above, tumor along the right hemidiaphragm could be invading across the diaphragm. CT ABDOMEN PELVIS FINDINGS Hepatobiliary: There variety of hepatic cysts as well as new metastatic foci in the liver. A 2.0 by 1.6 cm metastatic lesion  posteriorly in the right hepatic lobe is present on image 68/2. Moreover, the aid primary right kidney upper pole mass appears to be invading the liver on image 65/2. No portal vein thrombosis or visible hepatic vein thrombosis. No current biliary obstruction is identified although there is extensive tumor in the porta hepatis encasing the extrahepatic biliary tree. Pancreas: Porta hepatis tumor is tangential to the pancreatic body and head but not obviously invading the  pancreas. No dorsal pancreatic duct dilatation. Spleen: There potential deposits of tumor in the ascites surrounding the spleen but no metastatic lesion inside the parenchyma of the spleen is seen. Adrenals/Urinary Tract: The right kidney upper pole mass measures 8.3 by 6.2 cm (formerly 5.4 by 5.4 cm) and is invading the liver. There are numerous new deposits of tumor along the right perirenal space and along the right adrenal gland. The right adrenal tumor is somewhat indistinct but the main conglomerate adrenal mass measures 3.2 by 3.8 cm on image 63/2 and is new. Stomach/Bowel: The stomach body has a somewhat narrowed lumen. There are innumerable deposits of tumor in the mesentery around the stomach in some involvement of the gastric wall is not excluded given this unusual appearance. Mildly dilated loops of proximal small bowel are observed for example in the jejunum on image [id number], cause uncertain. Vascular/Lymphatic: Massive gastrohepatic ligament, porta hepatis, and retroperitoneal adenopathy, markedly worsened from prior and confluent today. Encasement of the celiac artery branches and portal vein bite confluent adenopathy, without definite tumor thrombus or thrombosis. The IVC is narrowed by surrounding tumor and could possibly be invaded. Conglomerate left para-aortic adenopathy at the level of left renal vein measures 4.0 by 4.2 cm, formerly 1.4 by 2.4 cm. Infrarenal abdominal aortic aneurysm, fusiform, 3.8 cm in diameter. Reproductive:  Mildly enlarged prostate gland. Other: Scattered ascites with innumerable deposits of tumor within the ascites, omentum, and mesentery. Diffuse mesenteric edema and mild subcutaneous edema. Musculoskeletal: Although I do not see direct bony involvement of tumor, there is some tumor extending into the thoracoabdominal wall in the right posterior eleventh intercostal space, image 69/2. This is along the region of the diaphragmatic tumor. IMPRESSION: 1. Markedly severe progression of malignancy burden, with extensive increased in adenopathy in the lower neck, chest, and abdomen; a large malignant right pleural effusion with tumor invading the diaphragm and extending into the right perirenal space; increase in size and number of pulmonary metastatic lesions; enlargement of the primary right kidney upper pole lesion with hepatic metastatic lesions and direct invasion of the liver by the tumor ; and extensive peritoneal, mesenteric, and omental studding of tumor. 2. There is a large pericardial effusion which could possibly cause tamponade. Cannot exclude malignant effusion. 3. Moderate left pleural effusion. 4. Small aneurysms of the ascending thoracic aorta and infrarenal abdominal aorta normally would merit follow up but are minor in comparison to the neoplastic findings. 5. Coronary, aortic arch, and branch vessel atherosclerotic vascular disease. Electronically Signed   By: Van Clines M.D.   On: 08/10/2016 09:13   Ct Abdomen Pelvis W Contrast  Result Date: 08/10/2016 CLINICAL DATA:  Metastatic right renal cell carcinoma, restaging EXAM: CT CHEST, ABDOMEN, AND PELVIS WITH CONTRAST TECHNIQUE: Multidetector CT imaging of the chest, abdomen and pelvis was performed following the standard protocol during bolus administration of intravenous contrast. CONTRAST:  100 cc Isovue 300 COMPARISON:  Multiple exams, including 05/08/2016 and 04/24/2016 FINDINGS: CT CHEST FINDINGS Cardiovascular: Coronary, aortic arch,  and branch vessel atherosclerotic vascular disease. Ascending aortic aneurysm, 4.1 cm diameter. Large pericardial effusion, previously small. Calcified aortic and mitral valves. Mediastinum/Nodes: Left supraclavicular node 2.7 cm in short axis, formerly 1.4 cm. Newly pathologic prevascular, left internal mammary, lower thoracic periaortic, and likely left axillary adenopathy, representative upper mediastinal node 1.7 cm in short axis on image 12/2 (formerly 0.6 cm) and representative left internal mammary node 1.0 cm in short axis on image 23/2 (formerly 0.3 cm). An upper paraesophageal lymph node is 0.8  cm in short axis on image 17/2, formerly not seen. Just above the hiatus a lymph node adjacent to the thoracic aorta measures 2.8 cm in short axis, previously 0.9 cm. Lungs/Pleura: Large right and moderate left pleural effusions, with extensive tumor along the right hemidiaphragm posteriorly extending into the pleural space, and questionably invading through the diaphragm and partially into the perirenal space on the right. Scattered metastatic lesions are present in the aerated portion of the lung, a representative nodule of the right middle lobe measuring 2.8 by 2.6 cm, formerly 0.7 by 0.7 cm. Other nodules are likewise enlarged. Musculoskeletal: Thoracic spondylosis. As noted above, tumor along the right hemidiaphragm could be invading across the diaphragm. CT ABDOMEN PELVIS FINDINGS Hepatobiliary: There variety of hepatic cysts as well as new metastatic foci in the liver. A 2.0 by 1.6 cm metastatic lesion posteriorly in the right hepatic lobe is present on image 68/2. Moreover, the aid primary right kidney upper pole mass appears to be invading the liver on image 65/2. No portal vein thrombosis or visible hepatic vein thrombosis. No current biliary obstruction is identified although there is extensive tumor in the porta hepatis encasing the extrahepatic biliary tree. Pancreas: Porta hepatis tumor is tangential  to the pancreatic body and head but not obviously invading the pancreas. No dorsal pancreatic duct dilatation. Spleen: There potential deposits of tumor in the ascites surrounding the spleen but no metastatic lesion inside the parenchyma of the spleen is seen. Adrenals/Urinary Tract: The right kidney upper pole mass measures 8.3 by 6.2 cm (formerly 5.4 by 5.4 cm) and is invading the liver. There are numerous new deposits of tumor along the right perirenal space and along the right adrenal gland. The right adrenal tumor is somewhat indistinct but the main conglomerate adrenal mass measures 3.2 by 3.8 cm on image 63/2 and is new. Stomach/Bowel: The stomach body has a somewhat narrowed lumen. There are innumerable deposits of tumor in the mesentery around the stomach in some involvement of the gastric wall is not excluded given this unusual appearance. Mildly dilated loops of proximal small bowel are observed for example in the jejunum on image [id number], cause uncertain. Vascular/Lymphatic: Massive gastrohepatic ligament, porta hepatis, and retroperitoneal adenopathy, markedly worsened from prior and confluent today. Encasement of the celiac artery branches and portal vein bite confluent adenopathy, without definite tumor thrombus or thrombosis. The IVC is narrowed by surrounding tumor and could possibly be invaded. Conglomerate left para-aortic adenopathy at the level of left renal vein measures 4.0 by 4.2 cm, formerly 1.4 by 2.4 cm. Infrarenal abdominal aortic aneurysm, fusiform, 3.8 cm in diameter. Reproductive: Mildly enlarged prostate gland. Other: Scattered ascites with innumerable deposits of tumor within the ascites, omentum, and mesentery. Diffuse mesenteric edema and mild subcutaneous edema. Musculoskeletal: Although I do not see direct bony involvement of tumor, there is some tumor extending into the thoracoabdominal wall in the right posterior eleventh intercostal space, image 69/2. This is along the region of  the diaphragmatic tumor. IMPRESSION: 1. Markedly severe progression of malignancy burden, with extensive increased in adenopathy in the lower neck, chest, and abdomen; a large malignant right pleural effusion with tumor invading the diaphragm and extending into the right perirenal space; increase in size and number of pulmonary metastatic lesions; enlargement of the primary right kidney upper pole lesion with hepatic metastatic lesions and direct invasion of the liver by the tumor ; and extensive peritoneal, mesenteric, and omental studding of tumor. 2. There is a large pericardial effusion which could possibly  cause tamponade. Cannot exclude malignant effusion. 3. Moderate left pleural effusion. 4. Small aneurysms of the ascending thoracic aorta and infrarenal abdominal aorta normally would merit follow up but are minor in comparison to the neoplastic findings. 5. Coronary, aortic arch, and branch vessel atherosclerotic vascular disease. Electronically Signed   By: Van Clines M.D.   On: 08/10/2016 09:13    Procedures Procedures (including critical care time)  Medications Ordered in ED Medications - No data to display   Initial Impression / Assessment and Plan / ED Course  I have reviewed the triage vital signs and the nursing notes.  Pertinent labs & imaging results that were available during my care of the patient were reviewed by me and considered in my medical decision making (see chart for details).  Clinical Course    Pt here for progressive SOB, CT with right pleural effusion as well as pericardial effusion.  Pt is comfortable in ED without any respiratory distress.  He has a low to normal blood pressure, no current clinical evidence of tamponade.  D/w Cardiologist - will get stat echo to eval for tamponade.  Hospitalist consulted for admission for ongoing dyspnea, pleural and pericardial effusion.    Final Clinical Impressions(s) / ED Diagnoses   Final diagnoses:  Dyspnea     New Prescriptions New Prescriptions   No medications on file     Quintella Reichert, MD 08/11/16 1546

## 2016-08-10 NOTE — H&P (Signed)
Bill Foley [id number] DOB: 21-Feb-1931 DOA: 08/10/2016     PCP: Phineas Inches, MD   Outpatient Specialists: On[facility] Patient coming from:    home Lives   With family    Chief Complaint: Dyspnea abnormal CT of chest  HPI: Bill Foley is a 82 y.o. 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  HTN, HLD, AOV sclerosis, and a small AAA    Presented with dyspnea orthopnea Patient had a CT scan done today for follow up of renal cell carcinoma which showed progression of his cancer and new liver metastases and large pleural effusion and large pericardial effusion. He was seen today by Dr. Marin Olp had discussion regarding overall poor prognosis with possibility of possibly initiating immunotherapy without therapy patient has between 4-6 weeks in which she was made aware of.   He does describe dyspnea with exertion and sometimes has hard time laying down flat  Makes him have chest discomfort. No chest pain associated this no nausea no vomiting no fever or chills.   Regarding pertinent Chronic problems:  Has hx of  Renal cell carcinoma with mets to the lungs followed by Dr Marin Olp chemotherapy Votrient 600 mg by mouth daily started 05/27/2016  with plans to continue this for 3 months but has been off currently and Xgeva 120 mg subcutaneous every month.    IN ER:  Temp (24hrs), Avg:97.9 F (36.6 C), Min:97.7 F (36.5 C), Max:98 F (36.7 C)   RR 20 HR 91 BP 130/77 Cr 1.54 WBC 11.5 Hg 10.3 CXR small right pleural effusion Following Medications were ordered in ER: Medications - No data to display   ER provider discussed case with:  Cardiology to obtain emergent bedside echo  Hospitalist was called for admission for pericardial effusion and pulmonary effusion  Review of Systems:    Pertinent positives include: fatigue, weight loss shortness of breath at rest, dyspnea on exertion , orthopnea  Constitutional:  No weight  loss, night sweats, Fevers, chills,  HEENT:  No headaches, Difficulty swallowing,Tooth/dental problems,Sore throat,  No sneezing, itching, ear ache, nasal congestion, post nasal drip,  Cardio-vascular:  No chest pain, Orthopnea, PND, anasarca, dizziness, palpitations.no Bilateral lower extremity swelling  GI:  No heartburn, indigestion, abdominal pain, nausea, vomiting, diarrhea, change in bowel habits, loss of appetite, melena, blood in stool, hematemesis Resp:  no . No , No excess mucus, no productive cough, No non-productive cough, No coughing up of blood.No change in color of mucus.No wheezing. Skin:  no rash or lesions. No jaundice GU:  no dysuria, change in color of urine, no urgency or frequency. No straining to urinate.  No flank pain.  Musculoskeletal:  No joint pain or no joint swelling. No decreased range of motion. No back pain.  Psych:  No change in mood or affect. No depression or anxiety. No memory loss.  Neuro: no localizing neurological complaints, no tingling, no weakness, no double vision, no gait abnormality, no slurred speech, no confusion  As per HPI otherwise 10 point review of systems negative.   Past Medical History: Past Medical History:  Diagnosis Date  . Aortic aneurysm ([place])   . Aortic aneurysm ([facility])    "JUST UNDER 2 CM"  . Arthritis    RT WRIST  . Basal cell carcinoma   . Gallstones   . GERD (gastroesophageal reflux disease)    OCCASIONAL  . Hyperlipidemia   . Hypertension   . Iron deficiency anemia due to  chronic blood loss 05/25/2016  . Primary cancer of right kidney with metastasis from kidney to other site [facility]) 05/25/2016  . Shortness of breath    WITH EXERTION  . Slow heart rate    "has been down to 45 in the past"  . Tinnitus   . Weak urinary stream    Past Surgical History:  Procedure Laterality Date  . ANKLE SURGERY Left 1975  . CHOLECYSTECTOMY N/A 04/08/2013   Procedure: LAPAROSCOPIC CHOLECYSTECTOMY WITH INTRAOPERATIVE  CHOLANGIOGRAM;  Surgeon: Imogene Burn. Georgette Dover, MD;  Location: [facility];  Service: General;  Laterality: N/A;  . COLONOSCOPY    . INGUINAL HERNIA REPAIR Bilateral 08/06/2013   Procedure: HERNIA REPAIR INGUINAL ADULT BILATERAL;  Surgeon: Imogene Burn. Georgette Dover, MD;  Location: [facility];  Service: General;  Laterality: Bilateral;  . LAPAROSCOPIC APPENDECTOMY N/A 04/08/2013   Procedure: LAPAROSCOPIC PARTIAL CECECTOMY;  Surgeon: Imogene Burn. Georgette Dover, MD;  Location: [facility];  Service: General;  Laterality: N/A;  . TONSILLECTOMY    . TOTAL KNEE ARTHROPLASTY  2007   RT  . VASECTOMY       Social History:  Ambulatory  independently      reports that he quit smoking about 53 years ago. He has never used smokeless tobacco. He reports that he drinks alcohol. He reports that he does not use drugs.  Allergies:  No Known Allergies     Family History:   Family History  Problem Relation Age of Onset  . Cancer Mother   . Alcohol abuse Father     Medications: Prior to Admission medications   Medication Sig Start Date End Date Taking? Authorizing Provider  amLODipine (NORVASC) 10 MG tablet Take 1 tablet (10 mg total) by mouth daily. 11/07/15   Mihai Croitoru, MD  HYDROcodone-acetaminophen (NORCO) 5-325 MG tablet Take 1 tablet by mouth every 4 (four) hours as needed for moderate pain. 08/07/16   Volanda Napoleon, MD  ibuprofen (ADVIL,MOTRIN) 200 MG tablet Take 200 mg by mouth every 6 (six) hours as needed for pain.    Historical Provider, MD  Astrid Drafts 300 MG CAPS Take 300 mg by mouth every Monday, Wednesday, and Friday.     Historical Provider, MD  losartan (COZAAR) 100 MG tablet Take 100 mg by mouth every morning.     Historical Provider, MD  ondansetron (ZOFRAN) 4 MG tablet Take 1 tablet (4 mg total) by mouth every 8 (eight) hours as needed for nausea or vomiting. 08/03/16   Volanda Napoleon, MD  pazopanib (VOTRIENT) 200 MG tablet Take 3 tablets (600 mg total) by mouth daily. Take on an empty  stomach. 05/25/16   Volanda Napoleon, MD  pravastatin (PRAVACHOL) 40 MG tablet Take 40 mg by mouth every evening.     Historical Provider, MD    Physical Exam: Patient Vitals for the past 24 hrs:  BP Temp Temp src Pulse Resp SpO2  08/10/16 2015 130/77 - - 91 20 93 %  08/10/16 2000 128/78 - - 89 18 97 %  08/10/16 1915 115/70 - - 92 (!) 0 96 %  08/10/16 1842 97/58 98 F (36.7 C) Oral 97 17 97 %  08/10/16 1703 106/75 98 F (36.7 C) Oral 96 17 97 %  08/10/16 1508 94/57 98 F (36.7 C) Oral 98 20 98 %    1. General:  in No Acute distress 2. Psychological: Alert and Oriented 3. Head/ENT:     Dry Mucous Membranes  Head Non traumatic, neck supple                             Poor Dentition 4. SKIN:  decreased Skin turgor,  Skin clean Dry and intact   Rash on the back consisten with multiple AK 5. Heart: Regular rate and rhythm distant Murmur, Rub or gallop 6. Lungs:  Decreased on right base no wheezes or crackles   7. Abdomen: Soft,   non-tender, Non distended 8. Lower extremities: no clubbing, cyanosis, or edema 9. Neurologically Grossly intact, moving all 4 extremities equally  10. MSK: Normal range of motion   body mass index is unknown because there is no height or weight on file.  Labs on Admission:   Labs on Admission: I have personally reviewed following labs and imaging studies  CBC:  Recent Labs Lab 08/10/16 1058 08/10/16 1515  WBC 10.9* 11.5*  NEUTROABS 8.3*  --   HGB 10.3* 10.3*  HCT 31.3* 32.3*  MCV 87 87.3  PLT 236 [id number]   Basic Metabolic Panel:  Recent Labs Lab 08/10/16 1058 08/10/16 1515  NA 139 137  K 4.0 3.9  CL 100 102  CO2 26 23  GLUCOSE 108 115*  BUN 43* 43*  CREATININE 1.6* 1.54*  CALCIUM 6.0* 5.6*   GFR: Estimated Creatinine Clearance: 39 mL/min (by C-G formula based on SCr of 1.54 mg/dL (H)). Liver Function Tests:  Recent Labs Lab 08/10/16 1058  AST 19  ALT 18  ALKPHOS 108*  BILITOT 0.70  PROT 5.4*  ALBUMIN  2.3*   No results for input(s): LIPASE, AMYLASE in the last 168 hours. No results for input(s): AMMONIA in the last 168 hours. Coagulation Profile: No results for input(s): INR, PROTIME in the last 168 hours. Cardiac Enzymes: No results for input(s): CKTOTAL, CKMB, CKMBINDEX, TROPONINI in the last 168 hours. BNP (last 3 results) No results for input(s): PROBNP in the last 8760 hours. HbA1C: No results for input(s): HGBA1C in the last 72 hours. CBG: No results for input(s): GLUCAP in the last 168 hours. Lipid Profile: No results for input(s): CHOL, HDL, LDLCALC, TRIG, CHOLHDL, LDLDIRECT in the last 72 hours. Thyroid Function Tests: No results for input(s): TSH, T4TOTAL, FREET4, T3FREE, THYROIDAB in the last 72 hours. Anemia Panel:  Recent Labs  08/10/16 1058  FERRITIN 1,573*  TIBC 141*  IRON 18*   Urine analysis: No results found for: COLORURINE, APPEARANCEUR, LABSPEC, PHURINE, GLUCOSEU, HGBUR, BILIRUBINUR, KETONESUR, PROTEINUR, UROBILINOGEN, NITRITE, LEUKOCYTESUR Sepsis Labs: @LABRCNTIP (procalcitonin:4,lacticidven:4) )No results found for this or any previous visit (from the past 240 hour(s)).    UA  not ordered  No results found for: HGBA1C  Estimated Creatinine Clearance: 39 mL/min (by C-G formula based on SCr of 1.54 mg/dL (H)).  BNP (last 3 results) No results for input(s): PROBNP in the last 8760 hours.   ECG REPORT  Independently reviewed Rate:98  Rhythm: RBBB ST&T Change: No acute ischemic changes  QTC 487  There were no vitals filed for this visit.   Cultures: No results found for: [place], [place], CULT, REPTSTATUS   Radiological Exams on Admission: Dg Chest 2 View  Result Date: 08/10/2016 CLINICAL DATA:  Fluid in the lungs EXAM: CHEST  2 VIEW COMPARISON:  CT scan of the chest same day FINDINGS: There is small right pleural effusion. Atelectasis or infiltrate is noted in right base. Tiny left pleural effusion. Bilateral pulmonary nodules are noted  consistent with metastatic disease. No pulmonary edema.  IMPRESSION: Small right pleural effusion. Atelectasis or infiltrate right base. Bilateral pulmonary nodules. Tiny left pleural effusion. No pulmonary edema. Electronically Signed   By: Lahoma Crocker M.D.   On: 08/10/2016 16:00   Ct Chest W Contrast  Result Date: 08/10/2016 CLINICAL DATA:  Metastatic right renal cell carcinoma, restaging EXAM: CT CHEST, ABDOMEN, AND PELVIS WITH CONTRAST TECHNIQUE: Multidetector CT imaging of the chest, abdomen and pelvis was performed following the standard protocol during bolus administration of intravenous contrast. CONTRAST:  100 cc Isovue 300 COMPARISON:  Multiple exams, including 05/08/2016 and 04/24/2016 FINDINGS: CT CHEST FINDINGS Cardiovascular: Coronary, aortic arch, and branch vessel atherosclerotic vascular disease. Ascending aortic aneurysm, 4.1 cm diameter. Large pericardial effusion, previously small. Calcified aortic and mitral valves. Mediastinum/Nodes: Left supraclavicular node 2.7 cm in short axis, formerly 1.4 cm. Newly pathologic prevascular, left internal mammary, lower thoracic periaortic, and likely left axillary adenopathy, representative upper mediastinal node 1.7 cm in short axis on image 12/2 (formerly 0.6 cm) and representative left internal mammary node 1.0 cm in short axis on image 23/2 (formerly 0.3 cm). An upper paraesophageal lymph node is 0.8 cm in short axis on image 17/2, formerly not seen. Just above the hiatus a lymph node adjacent to the thoracic aorta measures 2.8 cm in short axis, previously 0.9 cm. Lungs/Pleura: Large right and moderate left pleural effusions, with extensive tumor along the right hemidiaphragm posteriorly extending into the pleural space, and questionably invading through the diaphragm and partially into the perirenal space on the right. Scattered metastatic lesions are present in the aerated portion of the lung, a representative nodule of the right middle lobe  measuring 2.8 by 2.6 cm, formerly 0.7 by 0.7 cm. Other nodules are likewise enlarged. Musculoskeletal: Thoracic spondylosis. As noted above, tumor along the right hemidiaphragm could be invading across the diaphragm. CT ABDOMEN PELVIS FINDINGS Hepatobiliary: There variety of hepatic cysts as well as new metastatic foci in the liver. A 2.0 by 1.6 cm metastatic lesion posteriorly in the right hepatic lobe is present on image 68/2. Moreover, the aid primary right kidney upper pole mass appears to be invading the liver on image 65/2. No portal vein thrombosis or visible hepatic vein thrombosis. No current biliary obstruction is identified although there is extensive tumor in the porta hepatis encasing the extrahepatic biliary tree. Pancreas: Porta hepatis tumor is tangential to the pancreatic body and head but not obviously invading the pancreas. No dorsal pancreatic duct dilatation. Spleen: There potential deposits of tumor in the ascites surrounding the spleen but no metastatic lesion inside the parenchyma of the spleen is seen. Adrenals/Urinary Tract: The right kidney upper pole mass measures 8.3 by 6.2 cm (formerly 5.4 by 5.4 cm) and is invading the liver. There are numerous new deposits of tumor along the right perirenal space and along the right adrenal gland. The right adrenal tumor is somewhat indistinct but the main conglomerate adrenal mass measures 3.2 by 3.8 cm on image 63/2 and is new. Stomach/Bowel: The stomach body has a somewhat narrowed lumen. There are innumerable deposits of tumor in the mesentery around the stomach in some involvement of the gastric wall is not excluded given this unusual appearance. Mildly dilated loops of proximal small bowel are observed for example in the jejunum on image [id number], cause uncertain. Vascular/Lymphatic: Massive gastrohepatic ligament, porta hepatis, and retroperitoneal adenopathy, markedly worsened from prior and confluent today. Encasement of the celiac artery  branches and portal vein bite confluent adenopathy, without definite tumor thrombus or thrombosis. The IVC is  narrowed by surrounding tumor and could possibly be invaded. Conglomerate left para-aortic adenopathy at the level of left renal vein measures 4.0 by 4.2 cm, formerly 1.4 by 2.4 cm. Infrarenal abdominal aortic aneurysm, fusiform, 3.8 cm in diameter. Reproductive: Mildly enlarged prostate gland. Other: Scattered ascites with innumerable deposits of tumor within the ascites, omentum, and mesentery. Diffuse mesenteric edema and mild subcutaneous edema. Musculoskeletal: Although I do not see direct bony involvement of tumor, there is some tumor extending into the thoracoabdominal wall in the right posterior eleventh intercostal space, image 69/2. This is along the region of the diaphragmatic tumor. IMPRESSION: 1. Markedly severe progression of malignancy burden, with extensive increased in adenopathy in the lower neck, chest, and abdomen; a large malignant right pleural effusion with tumor invading the diaphragm and extending into the right perirenal space; increase in size and number of pulmonary metastatic lesions; enlargement of the primary right kidney upper pole lesion with hepatic metastatic lesions and direct invasion of the liver by the tumor ; and extensive peritoneal, mesenteric, and omental studding of tumor. 2. There is a large pericardial effusion which could possibly cause tamponade. Cannot exclude malignant effusion. 3. Moderate left pleural effusion. 4. Small aneurysms of the ascending thoracic aorta and infrarenal abdominal aorta normally would merit follow up but are minor in comparison to the neoplastic findings. 5. Coronary, aortic arch, and branch vessel atherosclerotic vascular disease. Electronically Signed   By: Van Clines M.D.   On: 08/10/2016 09:13   Ct Abdomen Pelvis W Contrast  Result Date: 08/10/2016 CLINICAL DATA:  Metastatic right renal cell carcinoma, restaging EXAM:  CT CHEST, ABDOMEN, AND PELVIS WITH CONTRAST TECHNIQUE: Multidetector CT imaging of the chest, abdomen and pelvis was performed following the standard protocol during bolus administration of intravenous contrast. CONTRAST:  100 cc Isovue 300 COMPARISON:  Multiple exams, including 05/08/2016 and 04/24/2016 FINDINGS: CT CHEST FINDINGS Cardiovascular: Coronary, aortic arch, and branch vessel atherosclerotic vascular disease. Ascending aortic aneurysm, 4.1 cm diameter. Large pericardial effusion, previously small. Calcified aortic and mitral valves. Mediastinum/Nodes: Left supraclavicular node 2.7 cm in short axis, formerly 1.4 cm. Newly pathologic prevascular, left internal mammary, lower thoracic periaortic, and likely left axillary adenopathy, representative upper mediastinal node 1.7 cm in short axis on image 12/2 (formerly 0.6 cm) and representative left internal mammary node 1.0 cm in short axis on image 23/2 (formerly 0.3 cm). An upper paraesophageal lymph node is 0.8 cm in short axis on image 17/2, formerly not seen. Just above the hiatus a lymph node adjacent to the thoracic aorta measures 2.8 cm in short axis, previously 0.9 cm. Lungs/Pleura: Large right and moderate left pleural effusions, with extensive tumor along the right hemidiaphragm posteriorly extending into the pleural space, and questionably invading through the diaphragm and partially into the perirenal space on the right. Scattered metastatic lesions are present in the aerated portion of the lung, a representative nodule of the right middle lobe measuring 2.8 by 2.6 cm, formerly 0.7 by 0.7 cm. Other nodules are likewise enlarged. Musculoskeletal: Thoracic spondylosis. As noted above, tumor along the right hemidiaphragm could be invading across the diaphragm. CT ABDOMEN PELVIS FINDINGS Hepatobiliary: There variety of hepatic cysts as well as new metastatic foci in the liver. A 2.0 by 1.6 cm metastatic lesion posteriorly in the right hepatic lobe is  present on image 68/2. Moreover, the aid primary right kidney upper pole mass appears to be invading the liver on image 65/2. No portal vein thrombosis or visible hepatic vein thrombosis. No current biliary  obstruction is identified although there is extensive tumor in the porta hepatis encasing the extrahepatic biliary tree. Pancreas: Porta hepatis tumor is tangential to the pancreatic body and head but not obviously invading the pancreas. No dorsal pancreatic duct dilatation. Spleen: There potential deposits of tumor in the ascites surrounding the spleen but no metastatic lesion inside the parenchyma of the spleen is seen. Adrenals/Urinary Tract: The right kidney upper pole mass measures 8.3 by 6.2 cm (formerly 5.4 by 5.4 cm) and is invading the liver. There are numerous new deposits of tumor along the right perirenal space and along the right adrenal gland. The right adrenal tumor is somewhat indistinct but the main conglomerate adrenal mass measures 3.2 by 3.8 cm on image 63/2 and is new. Stomach/Bowel: The stomach body has a somewhat narrowed lumen. There are innumerable deposits of tumor in the mesentery around the stomach in some involvement of the gastric wall is not excluded given this unusual appearance. Mildly dilated loops of proximal small bowel are observed for example in the jejunum on image [id number], cause uncertain. Vascular/Lymphatic: Massive gastrohepatic ligament, porta hepatis, and retroperitoneal adenopathy, markedly worsened from prior and confluent today. Encasement of the celiac artery branches and portal vein bite confluent adenopathy, without definite tumor thrombus or thrombosis. The IVC is narrowed by surrounding tumor and could possibly be invaded. Conglomerate left para-aortic adenopathy at the level of left renal vein measures 4.0 by 4.2 cm, formerly 1.4 by 2.4 cm. Infrarenal abdominal aortic aneurysm, fusiform, 3.8 cm in diameter. Reproductive: Mildly enlarged prostate gland. Other:  Scattered ascites with innumerable deposits of tumor within the ascites, omentum, and mesentery. Diffuse mesenteric edema and mild subcutaneous edema. Musculoskeletal: Although I do not see direct bony involvement of tumor, there is some tumor extending into the thoracoabdominal wall in the right posterior eleventh intercostal space, image 69/2. This is along the region of the diaphragmatic tumor. IMPRESSION: 1. Markedly severe progression of malignancy burden, with extensive increased in adenopathy in the lower neck, chest, and abdomen; a large malignant right pleural effusion with tumor invading the diaphragm and extending into the right perirenal space; increase in size and number of pulmonary metastatic lesions; enlargement of the primary right kidney upper pole lesion with hepatic metastatic lesions and direct invasion of the liver by the tumor ; and extensive peritoneal, mesenteric, and omental studding of tumor. 2. There is a large pericardial effusion which could possibly cause tamponade. Cannot exclude malignant effusion. 3. Moderate left pleural effusion. 4. Small aneurysms of the ascending thoracic aorta and infrarenal abdominal aorta normally would merit follow up but are minor in comparison to the neoplastic findings. 5. Coronary, aortic arch, and branch vessel atherosclerotic vascular disease. Electronically Signed   By: Van Clines M.D.   On: 08/10/2016 09:13    Chart has been reviewed    Assessment/Plan 82 y.o. 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  HTN, HLD, AOV sclerosis, and a small AAA being admitted for pericardial effusion and pleural effusion  Present on Admission: . Malignant pericardial effusion [facility]) appreciate cardiology consult, they have performed a bedside echo gram that showed pericardial effusion moderate with some early tamponade features with no need for emergent pericardiocentesis. Will hold off on diuresis for  now given soft blood pressures.  Marland Kitchen HTN (hypertension) we'll hold given some soft blood pressures . Hyperlipidemia continue home medications . Dyspnea secondary to pleural effusion discussed with pulmonary who will see patient in consult   pleural effusion -  pulmonology consult for therapeutic thoracentisis in AM . Primary cancer of right kidney with metastasis from kidney to other site [facility]) patient has recently been seen by oncologist defer to oncology regarding father management . RBBB with left anterior fascicular block - stable currently   . Aortic aneurysm (HCC) -stable . Aortic valve sclerosis chronic and stable   Other plan as per orders.  DVT prophylaxis:  SCD   Code Status:    Full code as per patient    Family Communication:   Family   at  Bedside  plan of care was discussed with  Daughter,   Disposition Plan:   To home once workup is complete and patient is stable                              Consults called: cardiology   Admission status:  inpatient       Level of care     tele            I have spent a total of 56 min on this admission    Markees Carns 08/10/2016, 9:21 PM    [facility]  Pager [phone]   after 2 AM please page floor coverage PA If 7AM-7PM, please contact the day team taking care of the patient  [email or web address]  Password [email or web address]

## 2016-08-10 NOTE — ED Triage Notes (Signed)
Pt was sent here by his oncologist. He had CT scan which showed he had fluid around his heart. Pt has some dyspnea with exertion, otherwise no acute distress noted.

## 2016-08-10 NOTE — ED Notes (Signed)
High calcium  5.6 reported to dr j Tomi Bamberger

## 2016-08-11 DIAGNOSIS — C801 Malignant (primary) neoplasm, unspecified: Secondary | ICD-10-CM | POA: Diagnosis not present

## 2016-08-11 DIAGNOSIS — E43 Unspecified severe protein-calorie malnutrition: Secondary | ICD-10-CM | POA: Insufficient documentation

## 2016-08-11 DIAGNOSIS — I318 Other specified diseases of pericardium: Secondary | ICD-10-CM | POA: Diagnosis not present

## 2016-08-11 LAB — T4, FREE: Free T4: 0.95 ng/dL (ref 0.61–1.12)

## 2016-08-11 LAB — COMPREHENSIVE METABOLIC PANEL
ALT: 15 U/L — ABNORMAL LOW (ref 17–63)
AST: 17 U/L (ref 15–41)
Albumin: 1.8 g/dL — ABNORMAL LOW (ref 3.5–5.0)
Alkaline Phosphatase: 110 U/L (ref 38–126)
Anion gap: 11 (ref 5–15)
BUN: 44 mg/dL — ABNORMAL HIGH (ref 6–20)
CHLORIDE: 101 mmol/L (ref 101–111)
CO2: 25 mmol/L (ref 22–32)
Calcium: 5.3 mg/dL — CL (ref 8.9–10.3)
Creatinine, Ser: 1.47 mg/dL — ABNORMAL HIGH (ref 0.61–1.24)
GFR, EST AFRICAN AMERICAN: 49 mL/min — AB (ref >60)
GFR, EST NON AFRICAN AMERICAN: 42 mL/min — AB (ref >60)
Glucose, Bld: 96 mg/dL (ref 65–99)
POTASSIUM: 3.6 mmol/L (ref 3.5–5.1)
SODIUM: 137 mmol/L (ref 135–145)
Total Bilirubin: 0.6 mg/dL (ref 0.3–1.2)
Total Protein: 4.7 g/dL — ABNORMAL LOW (ref 6.5–8.1)

## 2016-08-11 LAB — CBC
HEMATOCRIT: 29.8 % — AB (ref 39.0–52.0)
HEMOGLOBIN: 9.7 g/dL — AB (ref 13.0–17.0)
MCH: 28.2 pg (ref 26.0–34.0)
MCHC: 32.6 g/dL (ref 30.0–36.0)
MCV: 86.6 fL (ref 78.0–100.0)
Platelets: 221 10*3/uL (ref 150–400)
RBC: 3.44 MIL/uL — AB (ref 4.22–5.81)
WBC: 11.4 10*3/uL — AB (ref 4.0–10.5)

## 2016-08-11 LAB — MAGNESIUM: MAGNESIUM: 1.4 mg/dL — AB (ref 1.7–2.4)

## 2016-08-11 LAB — TSH: TSH: 9.109 u[IU]/mL — AB (ref 0.350–4.500)

## 2016-08-11 LAB — PHOSPHORUS: Phosphorus: 3 mg/dL (ref 2.5–4.6)

## 2016-08-11 MED ORDER — FUROSEMIDE 40 MG PO TABS
40.0000 mg | ORAL_TABLET | Freq: Once | ORAL | Status: AC
  Administered 2016-08-11: 40 mg via ORAL
  Filled 2016-08-11: qty 1

## 2016-08-11 MED ORDER — COLCHICINE 0.6 MG PO TABS
0.6000 mg | ORAL_TABLET | Freq: Two times a day (BID) | ORAL | Status: DC
Start: 2016-08-11 — End: 2016-08-13
  Administered 2016-08-11 – 2016-08-13 (×5): 0.6 mg via ORAL
  Filled 2016-08-11 (×5): qty 1

## 2016-08-11 MED ORDER — ENSURE ENLIVE PO LIQD
237.0000 mL | Freq: Two times a day (BID) | ORAL | Status: DC
Start: 2016-08-11 — End: 2016-08-13

## 2016-08-11 MED ORDER — ZOLPIDEM TARTRATE 5 MG PO TABS
5.0000 mg | ORAL_TABLET | Freq: Once | ORAL | Status: AC
  Administered 2016-08-11: 5 mg via ORAL
  Filled 2016-08-11: qty 1

## 2016-08-11 MED ORDER — SODIUM CHLORIDE 0.9 % IV SOLN
1.0000 g | Freq: Once | INTRAVENOUS | Status: AC
  Administered 2016-08-11: 1 g via INTRAVENOUS
  Filled 2016-08-11: qty 10

## 2016-08-11 MED ORDER — MAGNESIUM OXIDE 400 (241.3 MG) MG PO TABS
400.0000 mg | ORAL_TABLET | Freq: Two times a day (BID) | ORAL | Status: DC
  Administered 2016-08-11 – 2016-08-13 (×5): 400 mg via ORAL
  Filled 2016-08-11 (×5): qty 1

## 2016-08-11 MED ORDER — CALCIUM CARBONATE 1250 (500 CA) MG PO TABS
1.0000 | ORAL_TABLET | Freq: Two times a day (BID) | ORAL | Status: DC
  Administered 2016-08-11 – 2016-08-13 (×5): 500 mg via ORAL
  Filled 2016-08-11 (×5): qty 1

## 2016-08-11 MED ORDER — FERROUS SULFATE 325 (65 FE) MG PO TABS
325.0000 mg | ORAL_TABLET | Freq: Two times a day (BID) | ORAL | Status: DC
  Administered 2016-08-12 (×2): 325 mg via ORAL
  Filled 2016-08-11 (×2): qty 1

## 2016-08-11 NOTE — Progress Notes (Addendum)
CRITICAL VALUE ALERT  Critical value received:  Calcium 5.3  Date of notification:  11/25  Time of notification:  0251  Critical value read back: Yes  Nurse who received alert:  Bea Graff, RN  MD notified (1st page): Dr. Eulas Post    Time of first page:  0301  MD notified (2nd page):  Time of second page:  Responding MD: Dr. Eulas Post  Time MD responded:

## 2016-08-11 NOTE — Progress Notes (Signed)
Pt arrived to 2w10 via stretcher from ED. Able to ambulate off stretcher into room. Tele box placed, verified x2. VSS. Pt denies any pain, any SOB. Pt oriented to room, call bell within reach. Will continue to monitor.  Jaymes Graff, RN

## 2016-08-11 NOTE — Progress Notes (Signed)
Patient Name: Bill Foley Date of Encounter: 08/11/2016  Primary Cardiologist: [facility] Problem List     Principal Problem:   Malignant pericardial effusion [facility]) Active Problems:   Dyspnea   Aortic aneurysm (HCC)   HTN (hypertension)   Hyperlipidemia   Primary cancer of right kidney with metastasis from kidney to other site [facility])   RBBB with left anterior fascicular block   Asymptomatic PVCs   Aortic valve sclerosis     Subjective   Feeling about the same as yesterday.  Continued chest pain worse with laying flat but improved with sitting up. No change with deep inspiration. SOB roughly the same as it was yesterday.  Inpatient Medications    Scheduled Meds: . calcium carbonate  1 tablet Oral BID WC  . guaiFENesin  600 mg Oral BID  . pravastatin  40 mg Oral QPM  . sodium chloride flush  3 mL Intravenous Q12H  . sodium chloride flush  3 mL Intravenous Q12H   Continuous Infusions:  PRN Meds: sodium chloride, acetaminophen **OR** acetaminophen, albuterol, HYDROcodone-acetaminophen, ondansetron **OR** ondansetron (ZOFRAN) IV, polyethylene glycol, sodium chloride flush   Vital Signs    Vitals:   08/10/16 2245 08/10/16 2300 08/10/16 2353 08/11/16 0631  BP: 96/76 111/61 126/73 121/64  Pulse: 86 83 91 74  Resp:   20 18  Temp:   97.8 F (36.6 C) 97.9 F (36.6 C)  TempSrc:   Oral Oral  SpO2: 97% 92% 99% 94%  Weight:   164 lb 1.6 oz (74.4 kg)   Height:   6' (1.829 m)    No intake or output data in the 24 hours ending 08/11/16 0904 Filed Weights   08/10/16 2353  Weight: 164 lb 1.6 oz (74.4 kg)    Physical Exam    GEN: Well nourished, well developed, in no acute distress.  HEENT: Grossly normal.  Neck: Supple, no JVD, carotid bruits, or masses. Cardiac: RRR, no murmurs, rubs, or gallops. No clubbing, cyanosis, edema.  Radials/DP/PT 2+ and equal bilaterally.  Respiratory:  Respirations regular and unlabored, clear to auscultation  bilaterally. GI: Soft, nontender, nondistended, BS + x 4. MS: no deformity or atrophy. Skin: warm and dry, no rash. Neuro:  Strength and sensation are intact. Psych: AAOx3.  Normal affect.  Labs    CBC  Recent Labs  08/10/16 1058 08/10/16 1515 08/11/16 0147  WBC 10.9* 11.5* 11.4*  NEUTROABS 8.3*  --   --   HGB 10.3* 10.3* 9.7*  HCT 31.3* 32.3* 29.8*  MCV 87 87.3 86.6  PLT 236 238 [id number]   Basic Metabolic Panel  Recent Labs  08/10/16 1515 08/11/16 0147  NA 137 137  K 3.9 3.6  CL 102 101  CO2 23 25  GLUCOSE 115* 96  BUN 43* 44*  CREATININE 1.54* 1.47*  CALCIUM 5.6* 5.3*  MG  --  1.4*  PHOS  --  3.0   Liver Function Tests  Recent Labs  08/10/16 1058 08/11/16 0147  AST 19 17  ALT 18 15*  ALKPHOS 108* 110  BILITOT 0.70 0.6  PROT 5.4* 4.7*  ALBUMIN 2.3* 1.8*   No results for input(s): LIPASE, AMYLASE in the last 72 hours. Cardiac Enzymes No results for input(s): CKTOTAL, CKMB, CKMBINDEX, TROPONINI in the last 72 hours. BNP Invalid input(s): POCBNP D-Dimer No results for input(s): DDIMER in the last 72 hours. Hemoglobin A1C No results for input(s): HGBA1C in the last 72 hours. Fasting Lipid Panel No results for input(s): CHOL, HDL,  LDLCALC, TRIG, CHOLHDL, LDLDIRECT in the last 72 hours. Thyroid Function Tests  Recent Labs  08/11/16 0147  TSH 9.109*    Telemetry    Sinus rhythm - Personally Reviewed  ECG    Sinus rhythm, RBBB, LAFB - Personally Reviewed  Radiology    Dg Chest 2 View  Result Date: 08/10/2016 CLINICAL DATA:  Fluid in the lungs EXAM: CHEST  2 VIEW COMPARISON:  CT scan of the chest same day FINDINGS: There is small right pleural effusion. Atelectasis or infiltrate is noted in right base. Tiny left pleural effusion. Bilateral pulmonary nodules are noted consistent with metastatic disease. No pulmonary edema. IMPRESSION: Small right pleural effusion. Atelectasis or infiltrate right base. Bilateral pulmonary nodules. Tiny left  pleural effusion. No pulmonary edema. Electronically Signed   By: Lahoma Crocker M.D.   On: 08/10/2016 16:00   Ct Chest W Contrast  Result Date: 08/10/2016 CLINICAL DATA:  Metastatic right renal cell carcinoma, restaging EXAM: CT CHEST, ABDOMEN, AND PELVIS WITH CONTRAST TECHNIQUE: Multidetector CT imaging of the chest, abdomen and pelvis was performed following the standard protocol during bolus administration of intravenous contrast. CONTRAST:  100 cc Isovue 300 COMPARISON:  Multiple exams, including 05/08/2016 and 04/24/2016 FINDINGS: CT CHEST FINDINGS Cardiovascular: Coronary, aortic arch, and branch vessel atherosclerotic vascular disease. Ascending aortic aneurysm, 4.1 cm diameter. Large pericardial effusion, previously small. Calcified aortic and mitral valves. Mediastinum/Nodes: Left supraclavicular node 2.7 cm in short axis, formerly 1.4 cm. Newly pathologic prevascular, left internal mammary, lower thoracic periaortic, and likely left axillary adenopathy, representative upper mediastinal node 1.7 cm in short axis on image 12/2 (formerly 0.6 cm) and representative left internal mammary node 1.0 cm in short axis on image 23/2 (formerly 0.3 cm). An upper paraesophageal lymph node is 0.8 cm in short axis on image 17/2, formerly not seen. Just above the hiatus a lymph node adjacent to the thoracic aorta measures 2.8 cm in short axis, previously 0.9 cm. Lungs/Pleura: Large right and moderate left pleural effusions, with extensive tumor along the right hemidiaphragm posteriorly extending into the pleural space, and questionably invading through the diaphragm and partially into the perirenal space on the right. Scattered metastatic lesions are present in the aerated portion of the lung, a representative nodule of the right middle lobe measuring 2.8 by 2.6 cm, formerly 0.7 by 0.7 cm. Other nodules are likewise enlarged. Musculoskeletal: Thoracic spondylosis. As noted above, tumor along the right hemidiaphragm could  be invading across the diaphragm. CT ABDOMEN PELVIS FINDINGS Hepatobiliary: There variety of hepatic cysts as well as new metastatic foci in the liver. A 2.0 by 1.6 cm metastatic lesion posteriorly in the right hepatic lobe is present on image 68/2. Moreover, the aid primary right kidney upper pole mass appears to be invading the liver on image 65/2. No portal vein thrombosis or visible hepatic vein thrombosis. No current biliary obstruction is identified although there is extensive tumor in the porta hepatis encasing the extrahepatic biliary tree. Pancreas: Porta hepatis tumor is tangential to the pancreatic body and head but not obviously invading the pancreas. No dorsal pancreatic duct dilatation. Spleen: There potential deposits of tumor in the ascites surrounding the spleen but no metastatic lesion inside the parenchyma of the spleen is seen. Adrenals/Urinary Tract: The right kidney upper pole mass measures 8.3 by 6.2 cm (formerly 5.4 by 5.4 cm) and is invading the liver. There are numerous new deposits of tumor along the right perirenal space and along the right adrenal gland. The right adrenal tumor is  somewhat indistinct but the main conglomerate adrenal mass measures 3.2 by 3.8 cm on image 63/2 and is new. Stomach/Bowel: The stomach body has a somewhat narrowed lumen. There are innumerable deposits of tumor in the mesentery around the stomach in some involvement of the gastric wall is not excluded given this unusual appearance. Mildly dilated loops of proximal small bowel are observed for example in the jejunum on image [id number], cause uncertain. Vascular/Lymphatic: Massive gastrohepatic ligament, porta hepatis, and retroperitoneal adenopathy, markedly worsened from prior and confluent today. Encasement of the celiac artery branches and portal vein bite confluent adenopathy, without definite tumor thrombus or thrombosis. The IVC is narrowed by surrounding tumor and could possibly be invaded. Conglomerate left  para-aortic adenopathy at the level of left renal vein measures 4.0 by 4.2 cm, formerly 1.4 by 2.4 cm. Infrarenal abdominal aortic aneurysm, fusiform, 3.8 cm in diameter. Reproductive: Mildly enlarged prostate gland. Other: Scattered ascites with innumerable deposits of tumor within the ascites, omentum, and mesentery. Diffuse mesenteric edema and mild subcutaneous edema. Musculoskeletal: Although I do not see direct bony involvement of tumor, there is some tumor extending into the thoracoabdominal wall in the right posterior eleventh intercostal space, image 69/2. This is along the region of the diaphragmatic tumor. IMPRESSION: 1. Markedly severe progression of malignancy burden, with extensive increased in adenopathy in the lower neck, chest, and abdomen; a large malignant right pleural effusion with tumor invading the diaphragm and extending into the right perirenal space; increase in size and number of pulmonary metastatic lesions; enlargement of the primary right kidney upper pole lesion with hepatic metastatic lesions and direct invasion of the liver by the tumor ; and extensive peritoneal, mesenteric, and omental studding of tumor. 2. There is a large pericardial effusion which could possibly cause tamponade. Cannot exclude malignant effusion. 3. Moderate left pleural effusion. 4. Small aneurysms of the ascending thoracic aorta and infrarenal abdominal aorta normally would merit follow up but are minor in comparison to the neoplastic findings. 5. Coronary, aortic arch, and branch vessel atherosclerotic vascular disease. Electronically Signed   By: Van Clines M.D.   On: 08/10/2016 09:13   Ct Abdomen Pelvis W Contrast  Result Date: 08/10/2016 CLINICAL DATA:  Metastatic right renal cell carcinoma, restaging EXAM: CT CHEST, ABDOMEN, AND PELVIS WITH CONTRAST TECHNIQUE: Multidetector CT imaging of the chest, abdomen and pelvis was performed following the standard protocol during bolus administration of  intravenous contrast. CONTRAST:  100 cc Isovue 300 COMPARISON:  Multiple exams, including 05/08/2016 and 04/24/2016 FINDINGS: CT CHEST FINDINGS Cardiovascular: Coronary, aortic arch, and branch vessel atherosclerotic vascular disease. Ascending aortic aneurysm, 4.1 cm diameter. Large pericardial effusion, previously small. Calcified aortic and mitral valves. Mediastinum/Nodes: Left supraclavicular node 2.7 cm in short axis, formerly 1.4 cm. Newly pathologic prevascular, left internal mammary, lower thoracic periaortic, and likely left axillary adenopathy, representative upper mediastinal node 1.7 cm in short axis on image 12/2 (formerly 0.6 cm) and representative left internal mammary node 1.0 cm in short axis on image 23/2 (formerly 0.3 cm). An upper paraesophageal lymph node is 0.8 cm in short axis on image 17/2, formerly not seen. Just above the hiatus a lymph node adjacent to the thoracic aorta measures 2.8 cm in short axis, previously 0.9 cm. Lungs/Pleura: Large right and moderate left pleural effusions, with extensive tumor along the right hemidiaphragm posteriorly extending into the pleural space, and questionably invading through the diaphragm and partially into the perirenal space on the right. Scattered metastatic lesions are present in the aerated portion  of the lung, a representative nodule of the right middle lobe measuring 2.8 by 2.6 cm, formerly 0.7 by 0.7 cm. Other nodules are likewise enlarged. Musculoskeletal: Thoracic spondylosis. As noted above, tumor along the right hemidiaphragm could be invading across the diaphragm. CT ABDOMEN PELVIS FINDINGS Hepatobiliary: There variety of hepatic cysts as well as new metastatic foci in the liver. A 2.0 by 1.6 cm metastatic lesion posteriorly in the right hepatic lobe is present on image 68/2. Moreover, the aid primary right kidney upper pole mass appears to be invading the liver on image 65/2. No portal vein thrombosis or visible hepatic vein thrombosis. No  current biliary obstruction is identified although there is extensive tumor in the porta hepatis encasing the extrahepatic biliary tree. Pancreas: Porta hepatis tumor is tangential to the pancreatic body and head but not obviously invading the pancreas. No dorsal pancreatic duct dilatation. Spleen: There potential deposits of tumor in the ascites surrounding the spleen but no metastatic lesion inside the parenchyma of the spleen is seen. Adrenals/Urinary Tract: The right kidney upper pole mass measures 8.3 by 6.2 cm (formerly 5.4 by 5.4 cm) and is invading the liver. There are numerous new deposits of tumor along the right perirenal space and along the right adrenal gland. The right adrenal tumor is somewhat indistinct but the main conglomerate adrenal mass measures 3.2 by 3.8 cm on image 63/2 and is new. Stomach/Bowel: The stomach body has a somewhat narrowed lumen. There are innumerable deposits of tumor in the mesentery around the stomach in some involvement of the gastric wall is not excluded given this unusual appearance. Mildly dilated loops of proximal small bowel are observed for example in the jejunum on image [id number], cause uncertain. Vascular/Lymphatic: Massive gastrohepatic ligament, porta hepatis, and retroperitoneal adenopathy, markedly worsened from prior and confluent today. Encasement of the celiac artery branches and portal vein bite confluent adenopathy, without definite tumor thrombus or thrombosis. The IVC is narrowed by surrounding tumor and could possibly be invaded. Conglomerate left para-aortic adenopathy at the level of left renal vein measures 4.0 by 4.2 cm, formerly 1.4 by 2.4 cm. Infrarenal abdominal aortic aneurysm, fusiform, 3.8 cm in diameter. Reproductive: Mildly enlarged prostate gland. Other: Scattered ascites with innumerable deposits of tumor within the ascites, omentum, and mesentery. Diffuse mesenteric edema and mild subcutaneous edema. Musculoskeletal: Although I do not see  direct bony involvement of tumor, there is some tumor extending into the thoracoabdominal wall in the right posterior eleventh intercostal space, image 69/2. This is along the region of the diaphragmatic tumor. IMPRESSION: 1. Markedly severe progression of malignancy burden, with extensive increased in adenopathy in the lower neck, chest, and abdomen; a large malignant right pleural effusion with tumor invading the diaphragm and extending into the right perirenal space; increase in size and number of pulmonary metastatic lesions; enlargement of the primary right kidney upper pole lesion with hepatic metastatic lesions and direct invasion of the liver by the tumor ; and extensive peritoneal, mesenteric, and omental studding of tumor. 2. There is a large pericardial effusion which could possibly cause tamponade. Cannot exclude malignant effusion. 3. Moderate left pleural effusion. 4. Small aneurysms of the ascending thoracic aorta and infrarenal abdominal aorta normally would merit follow up but are minor in comparison to the neoplastic findings. 5. Coronary, aortic arch, and branch vessel atherosclerotic vascular disease. Electronically Signed   By: Van Clines M.D.   On: 08/10/2016 09:13    Cardiac Studies   TTE - Left ventricle: The cavity size was  normal. Wall thickness was   increased in a pattern of mild LVH. Systolic function was normal.   The estimated ejection fraction was in the range of 60% to 65%.   Doppler parameters are consistent with abnormal left ventricular   relaxation (grade 1 diastolic dysfunction). The E/e&' ratio is   between 8-15, suggesting indeterminate LV filling pressure. - Ventricular septum: Septal motion showed abnormal function and   dyssynergy. - Aortic valve: Trileaflet. Moderately calcified leaflets with what   appears to be an immobile right coronary cusp. There is mild   aortic stenosis without regurgitation. Mean gradient (S): 14 mm   Hg. Peak gradient (S):  22 mm Hg. Valve area (Vmax): 1.59 cm^2.   Valve area (Vmean): 1.52 cm^2. - Mitral valve: Calcified annulus. Mildly thickened leaflets . - Left atrium: The atrium was normal in size. - Right ventricle: The cavity size was normal. There is mild   diastolic RV collapse. - Right atrium: The atrium was normal in size. Mild diastolic   collapse. - Tricuspid valve: There was mild regurgitation. No respiratory   inflow variation. - Pulmonary arteries: PA peak pressure: 31 mm Hg (S). - Pericardium, extracardiac: Moderate sized pericardial effusion.   Features were not consistent with tamponade physiology. There was   a right pleural effusion. There was a left pleural effusion.   Assessment & Plan    Shortness of breath: Likely multifactorial due to metastatic renal cancer. Has a plural and pericardial effusion that is liekly malignant but no evidence of tamponade on his TTE.  His BP was low yesterday but has improved today.  Clare Casto try diuresis today to see if that Issabela Lesko help with his SOB.  Kimyatta Lecy give 40 lasix PO.  Chest pain: per history, unlikely to be due to CAD.  Worse when laying flat, and with effusion, possibly due to pericarditis.  Larkyn Greenberger start colchicine 0.6 mg.  Hypertension: continue to hold antihypertensive meds  Signed, Dilraj Killgore Meredith Leeds, MD  08/11/2016, 9:04 AM

## 2016-08-11 NOTE — Progress Notes (Signed)
Pt was never taken down to [facility] for thoracentesis.  Called the department around 1700 this afternoon and was told the PA had notified someone that pt got pushed till tomorrow morning.  I am not sure who PA spoke to.  Dr. Tyrell Antonio paged and made aware.

## 2016-08-11 NOTE — Progress Notes (Signed)
Initial Nutrition Assessment  DOCUMENTATION CODES:   Severe malnutrition in context of chronic illness  INTERVENTION:  - Will order Ensure Enlive po BID, each supplement provides 350 kcal and 20 grams of protein - Encourage PO intakes of meals and supplements. - RD will continue to monitor for POC/GOC and additional nutrition-related needs.  NUTRITION DIAGNOSIS:   Increased nutrient needs related to catabolic illness, cancer and cancer related treatments as evidenced by estimated needs.  GOAL:   Patient will meet greater than or equal to 90% of their needs  MONITOR:   PO intake, Supplement acceptance, Weight trends, Labs, I & O's  REASON FOR ASSESSMENT:   Malnutrition Screening Tool  ASSESSMENT:   82 y.o. 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  HTN, HLD, AOV sclerosis, and a small AAA. Presented with dyspnea orthopnea. Patient had a CT scan done today for follow up of renal cell carcinoma which showed progression of his cancer and new liver metastases and large pleural effusion and large pericardial effusion. He was seen today by Dr. Marin Foley had discussion regarding overall poor prognosis with possibility of possibly initiating immunotherapy without therapy patient has between 4-6 weeks in which he was made aware of. He does describe dyspnea with exertion and sometimes has hard time laying down flat because it makes him have chest discomfort.  Pt seen for MST. BMI indicates normal weight. Diet advanced from NPO to Heart Healthy following RD visit this AM. Pt reports that he has not had anything to eat or drink in 1.5 days and is feeling very hungry and thirsty. Pt reports that PTA he had a decreased appetite with "waves" and "spells" of nausea. He has Zofran at home to help with nausea but reports that he feels it was making him dizzy and lightheaded and he prefers not to take it d/t this. Pt denies nausea or any feelings of abdominal  discomfort so far today. He denies any chewing or swallowing difficulties or any pain with swallowing.   Physical assessment shows moderate to severe muscle and moderate to severe fat wasting to upper and lower body; no edema present at this time. Pt began on chemotherapy on 05/27/16 and since that time has lost weight. Based on CBW, he has lost 8 lbs (5% body weight) which is not significant for 2.5 month time frame; will continue to monitor weight trends.  Medications reviewed; 1 tablet Oscal BID, 1 g IV Ca gluconate x1 dose today, 40 mg oral Lasix x1 dose today, PRN Zofran, PRN Miralax. Labs reviewed; BUN: 44 mg/dL, creatinine: 1.47 mg/dL, Ca: 5.3 mg/dL, Mg: 1.4 mg/dL, GFR: 42 mL/min.   Diet Order:  Diet Heart Room service appropriate? Yes; Fluid consistency: Thin  Skin:  Reviewed, no issues  Last BM:  PTA/unknown  Height:   Ht Readings from Last 1 Encounters:  08/10/16 6' (1.829 m)    Weight:   Wt Readings from Last 1 Encounters:  08/10/16 164 lb 1.6 oz (74.4 kg)    Ideal Body Weight:  80.91 kg  BMI:  Body mass index is 22.26 kg/m.  Estimated Nutritional Needs:   Kcal:  [id number] (30-33 kcal/kg)  Protein:  104-119 grams (1.4-1.6 grams/kg)  Fluid:  >/= 2 L/day  EDUCATION NEEDS:   No education needs identified at this time    Bill Matin, MS, RD, LDN, CNSC Inpatient Clinical Dietitian Pager # [phone] After hours/weekend pager # [phone]

## 2016-08-11 NOTE — Progress Notes (Signed)
[facility] called, pt is on schedule for thoracentesis today, transport should be taking him down soon.

## 2016-08-11 NOTE — Progress Notes (Signed)
PROGRESS NOTE    Bill Foley  [id number] DOB: [birth year] DOA: 08/10/2016 PCP: Phineas Inches, MD    Brief Narrative: Bill Foley is a 82 y.o. 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  HTN, HLD, AOV sclerosis, and a small AAA. Patient was refer for admission by his oncologist Dr Marin Olp for further tx of pleural effusion and pericardial effusion. Patient went to see Dr Marin Olp for  Follow up. He had CT chest that showed moderate pericardial effusion, and pleural effusion.    Assessment & Plan:   Principal Problem:   Malignant pericardial effusion (HCC) Active Problems:   Dyspnea   Aortic aneurysm (HCC)   HTN (hypertension)   Hyperlipidemia   Primary cancer of right kidney with metastasis from kidney to other site [facility])   RBBB with left anterior fascicular block   Asymptomatic PVCs   Aortic valve sclerosis   Protein-calorie malnutrition, severe  Pericardial Effusion;  Likely malignant.  ECHO with  Not evidence of  pericardial tamponade.  Case discussed with Dr Curt Bears, plan is to treat medically. Oral lasix and colchicine to treat possible pericarditis. .   Metastasis renal cancer, follow by Dr Marin Olp.   Moderate Left side pleural effusion.  Discussed with pulmonary, recommend IR consult for thoracentesis.  Send fluid for culture, cytology.   Iron deficiency anemia, secondary to chronic blood loss.  Start ferrous sulfate   Hypocalcemia; corrected calcium 7.1; will start oral supplement.   Hypomagnesemia;  Will start oral magnesium supplement.   Elevated TSH at 9;  Free T 4 normal.  Needs repeat labs in 4 weeks.   AKI; monitor      DVT prophylaxis: (Lovenox/Heparin/SCD's/anticoagulated/None (if comfort care) Code Status: (Full/Partial - specify details) Family Communication: (Specify name, relationship & date discussed. NO "discussed with patient") Disposition Plan: (specify when and where you expect  patient to be discharged). Include barriers to DC in this tab.   Consultants:   Cardiology    Procedures: ECHO    Antimicrobials; none    Subjective: He is not really SOB, he came yesterday refer because of fluid lungs and heart.    Objective: Vitals:   08/10/16 2245 08/10/16 2300 08/10/16 2353 08/11/16 0631  BP: 96/76 111/61 126/73 121/64  Pulse: 86 83 91 74  Resp:   20 18  Temp:   97.8 F (36.6 C) 97.9 F (36.6 C)  TempSrc:   Oral Oral  SpO2: 97% 92% 99% 94%  Weight:   74.4 kg (164 lb 1.6 oz)   Height:   6' (1.829 m)    No intake or output data in the 24 hours ending 08/11/16 1048 Filed Weights   08/10/16 2353  Weight: 74.4 kg (164 lb 1.6 oz)    Examination:  General exam: Appears calm and comfortable  Respiratory system: Clear to auscultation. Respiratory effort normal. Cardiovascular system: S1 & S2 heard, RRR. No JVD, murmurs, rubs, gallops or clicks. No pedal edema. Gastrointestinal system: Abdomen is nondistended, soft and nontender. No organomegaly or masses felt. Normal bowel sounds heard. Central nervous system: Alert and oriented. No focal neurological deficits. Extremities: Symmetric 5 x 5 power. Skin: No rashes, lesions or ulcers Psychiatry: Judgement and insight appear normal. Mood & affect appropriate.     Data Reviewed: I have personally reviewed following labs and imaging studies  CBC:  Recent Labs Lab 08/10/16 1058 08/10/16 1515 08/11/16 0147  WBC 10.9* 11.5* 11.4*  NEUTROABS 8.3*  --   --  HGB 10.3* 10.3* 9.7*  HCT 31.3* 32.3* 29.8*  MCV 87 87.3 86.6  PLT 236 238 [id number]   Basic Metabolic Panel:  Recent Labs Lab 08/10/16 1058 08/10/16 1515 08/11/16 0147  NA 139 137 137  K 4.0 3.9 3.6  CL 100 102 101  CO2 26 23 25   GLUCOSE 108 115* 96  BUN 43* 43* 44*  CREATININE 1.6* 1.54* 1.47*  CALCIUM 6.0* 5.6* 5.3*  MG  --   --  1.4*  PHOS  --   --  3.0   GFR: Estimated Creatinine Clearance: 40.1 mL/min (by C-G formula based on  SCr of 1.47 mg/dL (H)). Liver Function Tests:  Recent Labs Lab 08/10/16 1058 08/11/16 0147  AST 19 17  ALT 18 15*  ALKPHOS 108* 110  BILITOT 0.70 0.6  PROT 5.4* 4.7*  ALBUMIN 2.3* 1.8*   No results for input(s): LIPASE, AMYLASE in the last 168 hours. No results for input(s): AMMONIA in the last 168 hours. Coagulation Profile: No results for input(s): INR, PROTIME in the last 168 hours. Cardiac Enzymes: No results for input(s): CKTOTAL, CKMB, CKMBINDEX, TROPONINI in the last 168 hours. BNP (last 3 results) No results for input(s): PROBNP in the last 8760 hours. HbA1C: No results for input(s): HGBA1C in the last 72 hours. CBG: No results for input(s): GLUCAP in the last 168 hours. Lipid Profile: No results for input(s): CHOL, HDL, LDLCALC, TRIG, CHOLHDL, LDLDIRECT in the last 72 hours. Thyroid Function Tests:  Recent Labs  08/11/16 0147  TSH 9.109*   Anemia Panel:  Recent Labs  08/10/16 1058  FERRITIN 1,573*  TIBC 141*  IRON 18*   Sepsis Labs: No results for input(s): PROCALCITON, LATICACIDVEN in the last 168 hours.  No results found for this or any previous visit (from the past 240 hour(s)).       Radiology Studies: Dg Chest 2 View  Result Date: 08/10/2016 CLINICAL DATA:  Fluid in the lungs EXAM: CHEST  2 VIEW COMPARISON:  CT scan of the chest same day FINDINGS: There is small right pleural effusion. Atelectasis or infiltrate is noted in right base. Tiny left pleural effusion. Bilateral pulmonary nodules are noted consistent with metastatic disease. No pulmonary edema. IMPRESSION: Small right pleural effusion. Atelectasis or infiltrate right base. Bilateral pulmonary nodules. Tiny left pleural effusion. No pulmonary edema. Electronically Signed   By: Lahoma Crocker M.D.   On: 08/10/2016 16:00   Ct Chest W Contrast  Result Date: 08/10/2016 CLINICAL DATA:  Metastatic right renal cell carcinoma, restaging EXAM: CT CHEST, ABDOMEN, AND PELVIS WITH CONTRAST  TECHNIQUE: Multidetector CT imaging of the chest, abdomen and pelvis was performed following the standard protocol during bolus administration of intravenous contrast. CONTRAST:  100 cc Isovue 300 COMPARISON:  Multiple exams, including 05/08/2016 and 04/24/2016 FINDINGS: CT CHEST FINDINGS Cardiovascular: Coronary, aortic arch, and branch vessel atherosclerotic vascular disease. Ascending aortic aneurysm, 4.1 cm diameter. Large pericardial effusion, previously small. Calcified aortic and mitral valves. Mediastinum/Nodes: Left supraclavicular node 2.7 cm in short axis, formerly 1.4 cm. Newly pathologic prevascular, left internal mammary, lower thoracic periaortic, and likely left axillary adenopathy, representative upper mediastinal node 1.7 cm in short axis on image 12/2 (formerly 0.6 cm) and representative left internal mammary node 1.0 cm in short axis on image 23/2 (formerly 0.3 cm). An upper paraesophageal lymph node is 0.8 cm in short axis on image 17/2, formerly not seen. Just above the hiatus a lymph node adjacent to the thoracic aorta measures 2.8 cm in short axis,  previously 0.9 cm. Lungs/Pleura: Large right and moderate left pleural effusions, with extensive tumor along the right hemidiaphragm posteriorly extending into the pleural space, and questionably invading through the diaphragm and partially into the perirenal space on the right. Scattered metastatic lesions are present in the aerated portion of the lung, a representative nodule of the right middle lobe measuring 2.8 by 2.6 cm, formerly 0.7 by 0.7 cm. Other nodules are likewise enlarged. Musculoskeletal: Thoracic spondylosis. As noted above, tumor along the right hemidiaphragm could be invading across the diaphragm. CT ABDOMEN PELVIS FINDINGS Hepatobiliary: There variety of hepatic cysts as well as new metastatic foci in the liver. A 2.0 by 1.6 cm metastatic lesion posteriorly in the right hepatic lobe is present on image 68/2. Moreover, the aid  primary right kidney upper pole mass appears to be invading the liver on image 65/2. No portal vein thrombosis or visible hepatic vein thrombosis. No current biliary obstruction is identified although there is extensive tumor in the porta hepatis encasing the extrahepatic biliary tree. Pancreas: Porta hepatis tumor is tangential to the pancreatic body and head but not obviously invading the pancreas. No dorsal pancreatic duct dilatation. Spleen: There potential deposits of tumor in the ascites surrounding the spleen but no metastatic lesion inside the parenchyma of the spleen is seen. Adrenals/Urinary Tract: The right kidney upper pole mass measures 8.3 by 6.2 cm (formerly 5.4 by 5.4 cm) and is invading the liver. There are numerous new deposits of tumor along the right perirenal space and along the right adrenal gland. The right adrenal tumor is somewhat indistinct but the main conglomerate adrenal mass measures 3.2 by 3.8 cm on image 63/2 and is new. Stomach/Bowel: The stomach body has a somewhat narrowed lumen. There are innumerable deposits of tumor in the mesentery around the stomach in some involvement of the gastric wall is not excluded given this unusual appearance. Mildly dilated loops of proximal small bowel are observed for example in the jejunum on image [id number], cause uncertain. Vascular/Lymphatic: Massive gastrohepatic ligament, porta hepatis, and retroperitoneal adenopathy, markedly worsened from prior and confluent today. Encasement of the celiac artery branches and portal vein bite confluent adenopathy, without definite tumor thrombus or thrombosis. The IVC is narrowed by surrounding tumor and could possibly be invaded. Conglomerate left para-aortic adenopathy at the level of left renal vein measures 4.0 by 4.2 cm, formerly 1.4 by 2.4 cm. Infrarenal abdominal aortic aneurysm, fusiform, 3.8 cm in diameter. Reproductive: Mildly enlarged prostate gland. Other: Scattered ascites with innumerable deposits  of tumor within the ascites, omentum, and mesentery. Diffuse mesenteric edema and mild subcutaneous edema. Musculoskeletal: Although I do not see direct bony involvement of tumor, there is some tumor extending into the thoracoabdominal wall in the right posterior eleventh intercostal space, image 69/2. This is along the region of the diaphragmatic tumor. IMPRESSION: 1. Markedly severe progression of malignancy burden, with extensive increased in adenopathy in the lower neck, chest, and abdomen; a large malignant right pleural effusion with tumor invading the diaphragm and extending into the right perirenal space; increase in size and number of pulmonary metastatic lesions; enlargement of the primary right kidney upper pole lesion with hepatic metastatic lesions and direct invasion of the liver by the tumor ; and extensive peritoneal, mesenteric, and omental studding of tumor. 2. There is a large pericardial effusion which could possibly cause tamponade. Cannot exclude malignant effusion. 3. Moderate left pleural effusion. 4. Small aneurysms of the ascending thoracic aorta and infrarenal abdominal aorta normally would merit follow up  but are minor in comparison to the neoplastic findings. 5. Coronary, aortic arch, and branch vessel atherosclerotic vascular disease. Electronically Signed   By: Van Clines M.D.   On: 08/10/2016 09:13   Ct Abdomen Pelvis W Contrast  Result Date: 08/10/2016 CLINICAL DATA:  Metastatic right renal cell carcinoma, restaging EXAM: CT CHEST, ABDOMEN, AND PELVIS WITH CONTRAST TECHNIQUE: Multidetector CT imaging of the chest, abdomen and pelvis was performed following the standard protocol during bolus administration of intravenous contrast. CONTRAST:  100 cc Isovue 300 COMPARISON:  Multiple exams, including 05/08/2016 and 04/24/2016 FINDINGS: CT CHEST FINDINGS Cardiovascular: Coronary, aortic arch, and branch vessel atherosclerotic vascular disease. Ascending aortic aneurysm, 4.1  cm diameter. Large pericardial effusion, previously small. Calcified aortic and mitral valves. Mediastinum/Nodes: Left supraclavicular node 2.7 cm in short axis, formerly 1.4 cm. Newly pathologic prevascular, left internal mammary, lower thoracic periaortic, and likely left axillary adenopathy, representative upper mediastinal node 1.7 cm in short axis on image 12/2 (formerly 0.6 cm) and representative left internal mammary node 1.0 cm in short axis on image 23/2 (formerly 0.3 cm). An upper paraesophageal lymph node is 0.8 cm in short axis on image 17/2, formerly not seen. Just above the hiatus a lymph node adjacent to the thoracic aorta measures 2.8 cm in short axis, previously 0.9 cm. Lungs/Pleura: Large right and moderate left pleural effusions, with extensive tumor along the right hemidiaphragm posteriorly extending into the pleural space, and questionably invading through the diaphragm and partially into the perirenal space on the right. Scattered metastatic lesions are present in the aerated portion of the lung, a representative nodule of the right middle lobe measuring 2.8 by 2.6 cm, formerly 0.7 by 0.7 cm. Other nodules are likewise enlarged. Musculoskeletal: Thoracic spondylosis. As noted above, tumor along the right hemidiaphragm could be invading across the diaphragm. CT ABDOMEN PELVIS FINDINGS Hepatobiliary: There variety of hepatic cysts as well as new metastatic foci in the liver. A 2.0 by 1.6 cm metastatic lesion posteriorly in the right hepatic lobe is present on image 68/2. Moreover, the aid primary right kidney upper pole mass appears to be invading the liver on image 65/2. No portal vein thrombosis or visible hepatic vein thrombosis. No current biliary obstruction is identified although there is extensive tumor in the porta hepatis encasing the extrahepatic biliary tree. Pancreas: Porta hepatis tumor is tangential to the pancreatic body and head but not obviously invading the pancreas. No dorsal  pancreatic duct dilatation. Spleen: There potential deposits of tumor in the ascites surrounding the spleen but no metastatic lesion inside the parenchyma of the spleen is seen. Adrenals/Urinary Tract: The right kidney upper pole mass measures 8.3 by 6.2 cm (formerly 5.4 by 5.4 cm) and is invading the liver. There are numerous new deposits of tumor along the right perirenal space and along the right adrenal gland. The right adrenal tumor is somewhat indistinct but the main conglomerate adrenal mass measures 3.2 by 3.8 cm on image 63/2 and is new. Stomach/Bowel: The stomach body has a somewhat narrowed lumen. There are innumerable deposits of tumor in the mesentery around the stomach in some involvement of the gastric wall is not excluded given this unusual appearance. Mildly dilated loops of proximal small bowel are observed for example in the jejunum on image [id number], cause uncertain. Vascular/Lymphatic: Massive gastrohepatic ligament, porta hepatis, and retroperitoneal adenopathy, markedly worsened from prior and confluent today. Encasement of the celiac artery branches and portal vein bite confluent adenopathy, without definite tumor thrombus or thrombosis. The IVC is  narrowed by surrounding tumor and could possibly be invaded. Conglomerate left para-aortic adenopathy at the level of left renal vein measures 4.0 by 4.2 cm, formerly 1.4 by 2.4 cm. Infrarenal abdominal aortic aneurysm, fusiform, 3.8 cm in diameter. Reproductive: Mildly enlarged prostate gland. Other: Scattered ascites with innumerable deposits of tumor within the ascites, omentum, and mesentery. Diffuse mesenteric edema and mild subcutaneous edema. Musculoskeletal: Although I do not see direct bony involvement of tumor, there is some tumor extending into the thoracoabdominal wall in the right posterior eleventh intercostal space, image 69/2. This is along the region of the diaphragmatic tumor. IMPRESSION: 1. Markedly severe progression of malignancy  burden, with extensive increased in adenopathy in the lower neck, chest, and abdomen; a large malignant right pleural effusion with tumor invading the diaphragm and extending into the right perirenal space; increase in size and number of pulmonary metastatic lesions; enlargement of the primary right kidney upper pole lesion with hepatic metastatic lesions and direct invasion of the liver by the tumor ; and extensive peritoneal, mesenteric, and omental studding of tumor. 2. There is a large pericardial effusion which could possibly cause tamponade. Cannot exclude malignant effusion. 3. Moderate left pleural effusion. 4. Small aneurysms of the ascending thoracic aorta and infrarenal abdominal aorta normally would merit follow up but are minor in comparison to the neoplastic findings. 5. Coronary, aortic arch, and branch vessel atherosclerotic vascular disease. Electronically Signed   By: Van Clines M.D.   On: 08/10/2016 09:13        Scheduled Meds: . calcium carbonate  1 tablet Oral BID WC  . colchicine  0.6 mg Oral BID  . feeding supplement (ENSURE ENLIVE)  237 mL Oral BID BM  . guaiFENesin  600 mg Oral BID  . pravastatin  40 mg Oral QPM  . sodium chloride flush  3 mL Intravenous Q12H  . sodium chloride flush  3 mL Intravenous Q12H   Continuous Infusions:   LOS: 0 days    Time spent: 35 minutes.     Elmarie Shiley, MD [facility] Pager [phone]  If 7PM-7AM, please contact night-coverage [email or web address] Password [facility] 08/11/2016, 10:48 AM

## 2016-08-12 ENCOUNTER — Observation Stay (HOSPITAL_COMMUNITY): Payer: MEDICARE

## 2016-08-12 DIAGNOSIS — I318 Other specified diseases of pericardium: Secondary | ICD-10-CM | POA: Diagnosis not present

## 2016-08-12 DIAGNOSIS — C7800 Secondary malignant neoplasm of unspecified lung: Secondary | ICD-10-CM | POA: Diagnosis not present

## 2016-08-12 DIAGNOSIS — Z85528 Personal history of other malignant neoplasm of kidney: Secondary | ICD-10-CM | POA: Diagnosis not present

## 2016-08-12 DIAGNOSIS — C787 Secondary malignant neoplasm of liver and intrahepatic bile duct: Secondary | ICD-10-CM | POA: Diagnosis not present

## 2016-08-12 DIAGNOSIS — I10 Essential (primary) hypertension: Secondary | ICD-10-CM | POA: Diagnosis not present

## 2016-08-12 DIAGNOSIS — C801 Malignant (primary) neoplasm, unspecified: Secondary | ICD-10-CM | POA: Diagnosis not present

## 2016-08-12 DIAGNOSIS — E785 Hyperlipidemia, unspecified: Secondary | ICD-10-CM | POA: Diagnosis not present

## 2016-08-12 DIAGNOSIS — E43 Unspecified severe protein-calorie malnutrition: Secondary | ICD-10-CM | POA: Diagnosis not present

## 2016-08-12 DIAGNOSIS — Z85828 Personal history of other malignant neoplasm of skin: Secondary | ICD-10-CM | POA: Diagnosis not present

## 2016-08-12 DIAGNOSIS — I452 Bifascicular block: Secondary | ICD-10-CM | POA: Diagnosis not present

## 2016-08-12 DIAGNOSIS — I714 Abdominal aortic aneurysm, without rupture: Secondary | ICD-10-CM | POA: Diagnosis not present

## 2016-08-12 DIAGNOSIS — N179 Acute kidney failure, unspecified: Secondary | ICD-10-CM | POA: Diagnosis not present

## 2016-08-12 DIAGNOSIS — D500 Iron deficiency anemia secondary to blood loss (chronic): Secondary | ICD-10-CM | POA: Diagnosis not present

## 2016-08-12 DIAGNOSIS — K219 Gastro-esophageal reflux disease without esophagitis: Secondary | ICD-10-CM | POA: Diagnosis not present

## 2016-08-12 DIAGNOSIS — R0600 Dyspnea, unspecified: Secondary | ICD-10-CM | POA: Diagnosis present

## 2016-08-12 DIAGNOSIS — I358 Other nonrheumatic aortic valve disorders: Secondary | ICD-10-CM | POA: Diagnosis not present

## 2016-08-12 DIAGNOSIS — Z96659 Presence of unspecified artificial knee joint: Secondary | ICD-10-CM | POA: Diagnosis not present

## 2016-08-12 DIAGNOSIS — J90 Pleural effusion, not elsewhere classified: Secondary | ICD-10-CM | POA: Diagnosis not present

## 2016-08-12 LAB — PROTEIN, BODY FLUID: Total protein, fluid: <3 g/dL

## 2016-08-12 LAB — CBC
HCT: 30.6 % — ABNORMAL LOW (ref 39.0–52.0)
HEMOGLOBIN: 9.7 g/dL — AB (ref 13.0–17.0)
MCH: 27.6 pg (ref 26.0–34.0)
MCHC: 31.7 g/dL (ref 30.0–36.0)
MCV: 86.9 fL (ref 78.0–100.0)
Platelets: 244 10*3/uL (ref 150–400)
RBC: 3.52 MIL/uL — AB (ref 4.22–5.81)
RDW: 27.3 % — ABNORMAL HIGH (ref 11.5–15.5)
WBC: 10.3 10*3/uL (ref 4.0–10.5)

## 2016-08-12 LAB — LACTATE DEHYDROGENASE: LDH: 246 U/L — AB (ref 98–192)

## 2016-08-12 LAB — BODY FLUID CELL COUNT WITH DIFFERENTIAL
EOS FL: 1 %
LYMPHS FL: 61 %
MONOCYTE-MACROPHAGE-SEROUS FLUID: 32 % — AB (ref 50–90)
NEUTROPHIL FLUID: 6 % (ref 0–25)
Total Nucleated Cell Count, Fluid: 303 cu mm (ref 0–1000)

## 2016-08-12 LAB — PROTEIN, TOTAL: TOTAL PROTEIN: 4.6 g/dL — AB (ref 6.5–8.1)

## 2016-08-12 LAB — BASIC METABOLIC PANEL
Anion gap: 11 (ref 5–15)
BUN: 37 mg/dL — ABNORMAL HIGH (ref 6–20)
CHLORIDE: 100 mmol/L — AB (ref 101–111)
CO2: 26 mmol/L (ref 22–32)
CREATININE: 1.21 mg/dL (ref 0.61–1.24)
Calcium: 5.6 mg/dL — CL (ref 8.9–10.3)
GFR, EST NON AFRICAN AMERICAN: 54 mL/min — AB (ref >60)
Glucose, Bld: 132 mg/dL — ABNORMAL HIGH (ref 65–99)
Potassium: 3.7 mmol/L (ref 3.5–5.1)
SODIUM: 137 mmol/L (ref 135–145)

## 2016-08-12 LAB — GLUCOSE, SEROUS FLUID: Glucose, Fluid: 121 mg/dL

## 2016-08-12 LAB — GRAM STAIN

## 2016-08-12 LAB — LACTATE DEHYDROGENASE, PLEURAL OR PERITONEAL FLUID: LD FL: 137 U/L — AB (ref 3–23)

## 2016-08-12 LAB — T3, FREE: T3 FREE: 1.3 pg/mL — AB (ref 2.0–4.4)

## 2016-08-12 MED ORDER — LIDOCAINE HCL 1 % IJ SOLN
INTRAMUSCULAR | Status: AC
  Filled 2016-08-12: qty 20

## 2016-08-12 MED ORDER — SODIUM CHLORIDE 0.9 % IV SOLN
1.0000 g | Freq: Once | INTRAVENOUS | Status: AC
  Administered 2016-08-12: 1 g via INTRAVENOUS
  Filled 2016-08-12 (×2): qty 10

## 2016-08-12 NOTE — Progress Notes (Signed)
CRITICAL VALUE ALERT  Critical value received:  Ca: 5.6  Date of notification:  08/12/16  Time of notification:  04:33 am  Critical value read back: yes  Nurse who received alert:  Rich Number, RN, BSN  MD notified (1st page):  Dr. Tana Coast  Time of first page: 04:39 am  MD notified (2nd page):  Time of second page:  Responding MD:  Dr. Tana Coast  Time MD responded:  04:39 am

## 2016-08-12 NOTE — Progress Notes (Signed)
PROGRESS NOTE    Bill Foley  [id number] DOB: 1931/08/19 DOA: 08/10/2016 PCP: Phineas Inches, MD    Brief Narrative: Bill Foley is a 82 y.o. 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  HTN, HLD, AOV sclerosis, and a small AAA. Patient was refer for admission by his oncologist Dr Marin Olp for further tx of pleural effusion and pericardial effusion. Patient went to see Dr Marin Olp for  Follow up. He had CT chest that showed moderate pericardial effusion, and pleural effusion.    Assessment & Plan:   Principal Problem:   Malignant pericardial effusion (HCC) Active Problems:   Dyspnea   Aortic aneurysm (HCC)   HTN (hypertension)   Hyperlipidemia   Primary cancer of right kidney with metastasis from kidney to other site [facility])   RBBB with left anterior fascicular block   Asymptomatic PVCs   Aortic valve sclerosis   Protein-calorie malnutrition, severe  Pericardial Effusion;  Likely malignant.  ECHO with  Not evidence of  pericardial tamponade.  Case discussed with Dr Curt Bears, plan is to treat medically for now. colchicine to treat possible pericarditis. .  BP stable. Continue to hold BP medications.  Received one time dose  of lasix 11-25.   Metastasis renal cancer, follow by Dr Marin Olp.   Moderate Left side pleural effusion.  S/P thoracentesis yielding 1.6 L.  Follow culture, cytology from pleural fluid.    Iron deficiency anemia, secondary to chronic blood loss.  Continue with ferrous sulfate   Hypocalcemia; corrected calcium 7.1; startwd oral supplement.   Hypomagnesemia;  Continue with  oral magnesium supplement.   Elevated TSH at 9;  Free T 4 normal.  Needs repeat labs in 4 weeks.   AKI; monitor      DVT prophylaxis:  Code Status: Full code.  Family Communication: care discussed with patient.  Disposition Plan: remain inpatient.   Consultants:   Cardiology    Procedures: ECHO     Antimicrobials; none    Subjective: He denies dyspnea.  Pleuritic chest pain improving.    Objective: Vitals:   08/11/16 2227 08/12/16 0549 08/12/16 0829 08/12/16 0849  BP: (!) 120/59 123/63 122/62 107/68  Pulse: 81 78    Resp: 18 18    Temp: 98.8 F (37.1 C) 97.9 F (36.6 C)    TempSrc: Oral Oral    SpO2: 95% 94%    Weight:      Height:       No intake or output data in the 24 hours ending 08/12/16 1449 Filed Weights   08/10/16 2353  Weight: 74.4 kg (164 lb 1.6 oz)    Examination:  General exam: Appears calm and comfortable  Respiratory system: Clear to auscultation. Respiratory effort normal. Cardiovascular system: S1 & S2 heard, RRR. No JVD, murmurs, rubs, gallops or clicks. No pedal edema. Gastrointestinal system: Abdomen is nondistended, soft and nontender. No organomegaly or masses felt. Normal bowel sounds heard. Central nervous system: Alert and oriented. No focal neurological deficits. Extremities: Symmetric 5 x 5 power. Skin: No rashes, lesions or ulcers Psychiatry: Judgement and insight appear normal. Mood & affect appropriate.     Data Reviewed: I have personally reviewed following labs and imaging studies  CBC:  Recent Labs Lab 08/10/16 1058 08/10/16 1515 08/11/16 0147 08/12/16 0326  WBC 10.9* 11.5* 11.4* 10.3  NEUTROABS 8.3*  --   --   --   HGB 10.3* 10.3* 9.7* 9.7*  HCT 31.3* 32.3* 29.8* 30.6*  MCV  87 87.3 86.6 86.9  PLT 236 238 221 [id number]   Basic Metabolic Panel:  Recent Labs Lab 08/10/16 1058 08/10/16 1515 08/11/16 0147 08/12/16 0326  NA 139 137 137 137  K 4.0 3.9 3.6 3.7  CL 100 102 101 100*  CO2 26 23 25 26   GLUCOSE 108 115* 96 132*  BUN 43* 43* 44* 37*  CREATININE 1.6* 1.54* 1.47* 1.21  CALCIUM 6.0* 5.6* 5.3* 5.6*  MG  --   --  1.4*  --   PHOS  --   --  3.0  --    GFR: Estimated Creatinine Clearance: 48.7 mL/min (by C-G formula based on SCr of 1.21 mg/dL). Liver Function Tests:  Recent Labs Lab 08/10/16 1058  08/11/16 0147 08/12/16 1138  AST 19 17  --   ALT 18 15*  --   ALKPHOS 108* 110  --   BILITOT 0.70 0.6  --   PROT 5.4* 4.7* 4.6*  ALBUMIN 2.3* 1.8*  --    No results for input(s): LIPASE, AMYLASE in the last 168 hours. No results for input(s): AMMONIA in the last 168 hours. Coagulation Profile: No results for input(s): INR, PROTIME in the last 168 hours. Cardiac Enzymes: No results for input(s): CKTOTAL, CKMB, CKMBINDEX, TROPONINI in the last 168 hours. BNP (last 3 results) No results for input(s): PROBNP in the last 8760 hours. HbA1C: No results for input(s): HGBA1C in the last 72 hours. CBG: No results for input(s): GLUCAP in the last 168 hours. Lipid Profile: No results for input(s): CHOL, HDL, LDLCALC, TRIG, CHOLHDL, LDLDIRECT in the last 72 hours. Thyroid Function Tests:  Recent Labs  08/11/16 0147 08/11/16 1153 08/11/16 1154  TSH 9.109*  --   --   FREET4  --  0.95  --   T3FREE  --   --  1.3*   Anemia Panel:  Recent Labs  08/10/16 1058  FERRITIN 1,573*  TIBC 141*  IRON 18*   Sepsis Labs: No results for input(s): PROCALCITON, LATICACIDVEN in the last 168 hours.  No results found for this or any previous visit (from the past 240 hour(s)).       Radiology Studies: Dg Chest 1 View  Result Date: 08/12/2016 CLINICAL DATA:  82 year old male status post ultrasound-guided right side thoracentesis this morning. Initial encounter. EXAM: CHEST 1 VIEW COMPARISON:  Chest radiographs 08/10/2016. FINDINGS: PA view at 0845 hours. Regressed right pleural effusion and improved right lung base ventilation. No pneumothorax. Stable left lung. Stable cardiac size and mediastinal contours. Calcified aortic atherosclerosis. Visualized tracheal air column is within normal limits. No acute osseous abnormality identified. Increased oral contrast in left upper quadrant bowel. IMPRESSION: No pneumothorax following right side thoracentesis. Regressed right pleural effusion and improved  right lung base ventilation. Electronically Signed   By: Genevie Ann M.D.   On: 08/12/2016 09:08   Dg Chest 2 View  Result Date: 08/10/2016 CLINICAL DATA:  Fluid in the lungs EXAM: CHEST  2 VIEW COMPARISON:  CT scan of the chest same day FINDINGS: There is small right pleural effusion. Atelectasis or infiltrate is noted in right base. Tiny left pleural effusion. Bilateral pulmonary nodules are noted consistent with metastatic disease. No pulmonary edema. IMPRESSION: Small right pleural effusion. Atelectasis or infiltrate right base. Bilateral pulmonary nodules. Tiny left pleural effusion. No pulmonary edema. Electronically Signed   By: Lahoma Crocker M.D.   On: 08/10/2016 16:00   US Thoracentesis Asp Pleural Space W/img Guide  Result Date: 08/12/2016 INDICATION: History of renal cell  carcinoma with metastatic disease. Patient likely has malignant pericardial effusion and is noted to also have a right -sided pleural effusion. Request is made for diagnostic and therapeutic thoracentesis. EXAM: ULTRASOUND GUIDED DIAGNOSTIC AND THERAPEUTIC THORACENTESIS MEDICATIONS: 1% lidocaine. COMPLICATIONS: None immediate. PROCEDURE: An ultrasound guided thoracentesis was thoroughly discussed with the patient and questions answered. The benefits, risks, alternatives and complications were also discussed. The patient understands and wishes to proceed with the procedure. Written consent was obtained. Ultrasound was performed to localize and mark an adequate pocket of fluid in the right chest. The area was then prepped and draped in the normal sterile fashion. 1% Lidocaine was used for local anesthesia. Under ultrasound guidance a Safe-T-Centesis catheter was introduced. Thoracentesis was performed. The catheter was removed and a dressing applied. FINDINGS: A total of approximately 1.6 L of yellow fluid was removed. Samples were sent to the laboratory as requested by the clinical team. IMPRESSION: Successful ultrasound guided right  thoracentesis yielding 1.6 L of pleural fluid. Read by: Saverio Danker, PA-C Electronically Signed   By: Jacqulynn Cadet M.D.   On: 08/12/2016 09:24        Scheduled Meds: . calcium carbonate  1 tablet Oral BID WC  . colchicine  0.6 mg Oral BID  . feeding supplement (ENSURE ENLIVE)  237 mL Oral BID BM  . ferrous sulfate  325 mg Oral BID WC  . guaiFENesin  600 mg Oral BID  . lidocaine      . magnesium oxide  400 mg Oral BID  . pravastatin  40 mg Oral QPM  . sodium chloride flush  3 mL Intravenous Q12H  . sodium chloride flush  3 mL Intravenous Q12H   Continuous Infusions:   LOS: 0 days    Time spent: 35 minutes.     Elmarie Shiley, MD [facility] Pager [phone]  If 7PM-7AM, please contact night-coverage [email or web address] Password [facility] 08/12/2016, 2:49 PM

## 2016-08-12 NOTE — Progress Notes (Signed)
Patient Name: Bill Foley Date of Encounter: 08/12/2016  Primary Cardiologist: [facility] Problem List     Principal Problem:   Malignant pericardial effusion [facility]) Active Problems:   Dyspnea   Aortic aneurysm (HCC)   HTN (hypertension)   Hyperlipidemia   Primary cancer of right kidney with metastasis from kidney to other site [name])   RBBB with left anterior fascicular block   Asymptomatic PVCs   Aortic valve sclerosis   Protein-calorie malnutrition, severe     Subjective   Feeling about the same as yesterday.  Feels that chest pain has improved a little.  SOB has also improved. Planned for thoracentesis today.  Inpatient Medications    Scheduled Meds: . calcium carbonate  1 tablet Oral BID WC  . colchicine  0.6 mg Oral BID  . feeding supplement (ENSURE ENLIVE)  237 mL Oral BID BM  . ferrous sulfate  325 mg Oral BID WC  . guaiFENesin  600 mg Oral BID  . magnesium oxide  400 mg Oral BID  . pravastatin  40 mg Oral QPM  . sodium chloride flush  3 mL Intravenous Q12H  . sodium chloride flush  3 mL Intravenous Q12H   Continuous Infusions:  PRN Meds: sodium chloride, acetaminophen **OR** acetaminophen, albuterol, HYDROcodone-acetaminophen, ondansetron **OR** ondansetron (ZOFRAN) IV, polyethylene glycol, sodium chloride flush   Vital Signs    Vitals:   08/11/16 0631 08/11/16 1332 08/11/16 2227 08/12/16 0549  BP: 121/64 109/64 (!) 120/59 123/63  Pulse: 74 83 81 78  Resp: 18 19 18 18   Temp: 97.9 F (36.6 C) 97.9 F (36.6 C) 98.8 F (37.1 C) 97.9 F (36.6 C)  TempSrc: Oral Oral Oral Oral  SpO2: 94% 96% 95% 94%  Weight:      Height:       No intake or output data in the 24 hours ending 08/12/16 0803 Filed Weights   08/10/16 2353  Weight: 164 lb 1.6 oz (74.4 kg)    Physical Exam    GEN: Well nourished, well developed, in no acute distress.  HEENT: Grossly normal.  Neck: Supple, no JVD, carotid bruits, or masses. Cardiac: RRR, no murmurs,  rubs, or gallops. No clubbing, cyanosis, edema.  Radials/DP/PT 2+ and equal bilaterally.  Respiratory:  Respirations regular and unlabored, clear to auscultation bilaterally. GI: Soft, nontender, nondistended, BS + x 4. MS: no deformity or atrophy. Skin: warm and dry, no rash. Neuro:  Strength and sensation are intact. Psych: AAOx3.  Normal affect.  Labs    CBC  Recent Labs  08/10/16 1058  08/11/16 0147 08/12/16 0326  WBC 10.9*  < > 11.4* 10.3  NEUTROABS 8.3*  --   --   --   HGB 10.3*  < > 9.7* 9.7*  HCT 31.3*  < > 29.8* 30.6*  MCV 87  < > 86.6 86.9  PLT 236  < > 221 244  < > = values in this interval not displayed. Basic Metabolic Panel  Recent Labs  08/11/16 0147 08/12/16 0326  NA 137 137  K 3.6 3.7  CL 101 100*  CO2 25 26  GLUCOSE 96 132*  BUN 44* 37*  CREATININE 1.47* 1.21  CALCIUM 5.3* 5.6*  MG 1.4*  --   PHOS 3.0  --    Liver Function Tests  Recent Labs  08/10/16 1058 08/11/16 0147  AST 19 17  ALT 18 15*  ALKPHOS 108* 110  BILITOT 0.70 0.6  PROT 5.4* 4.7*  ALBUMIN 2.3* 1.8*  No results for input(s): LIPASE, AMYLASE in the last 72 hours. Cardiac Enzymes No results for input(s): CKTOTAL, CKMB, CKMBINDEX, TROPONINI in the last 72 hours. BNP Invalid input(s): POCBNP D-Dimer No results for input(s): DDIMER in the last 72 hours. Hemoglobin A1C No results for input(s): HGBA1C in the last 72 hours. Fasting Lipid Panel No results for input(s): CHOL, HDL, LDLCALC, TRIG, CHOLHDL, LDLDIRECT in the last 72 hours. Thyroid Function Tests  Recent Labs  08/11/16 0147 08/11/16 1154  TSH 9.109*  --   T3FREE  --  1.3*    Telemetry    Sinus rhythm - Personally Reviewed  ECG    Sinus rhythm, RBBB, LAFB - Personally Reviewed  Radiology    Dg Chest 2 View  Result Date: 08/10/2016 CLINICAL DATA:  Fluid in the lungs EXAM: CHEST  2 VIEW COMPARISON:  CT scan of the chest same day FINDINGS: There is small right pleural effusion. Atelectasis or  infiltrate is noted in right base. Tiny left pleural effusion. Bilateral pulmonary nodules are noted consistent with metastatic disease. No pulmonary edema. IMPRESSION: Small right pleural effusion. Atelectasis or infiltrate right base. Bilateral pulmonary nodules. Tiny left pleural effusion. No pulmonary edema. Electronically Signed   By: Lahoma Crocker M.D.   On: 08/10/2016 16:00   Ct Chest W Contrast  Result Date: 08/10/2016 CLINICAL DATA:  Metastatic right renal cell carcinoma, restaging EXAM: CT CHEST, ABDOMEN, AND PELVIS WITH CONTRAST TECHNIQUE: Multidetector CT imaging of the chest, abdomen and pelvis was performed following the standard protocol during bolus administration of intravenous contrast. CONTRAST:  100 cc Isovue 300 COMPARISON:  Multiple exams, including 05/08/2016 and 04/24/2016 FINDINGS: CT CHEST FINDINGS Cardiovascular: Coronary, aortic arch, and branch vessel atherosclerotic vascular disease. Ascending aortic aneurysm, 4.1 cm diameter. Large pericardial effusion, previously small. Calcified aortic and mitral valves. Mediastinum/Nodes: Left supraclavicular node 2.7 cm in short axis, formerly 1.4 cm. Newly pathologic prevascular, left internal mammary, lower thoracic periaortic, and likely left axillary adenopathy, representative upper mediastinal node 1.7 cm in short axis on image 12/2 (formerly 0.6 cm) and representative left internal mammary node 1.0 cm in short axis on image 23/2 (formerly 0.3 cm). An upper paraesophageal lymph node is 0.8 cm in short axis on image 17/2, formerly not seen. Just above the hiatus a lymph node adjacent to the thoracic aorta measures 2.8 cm in short axis, previously 0.9 cm. Lungs/Pleura: Large right and moderate left pleural effusions, with extensive tumor along the right hemidiaphragm posteriorly extending into the pleural space, and questionably invading through the diaphragm and partially into the perirenal space on the right. Scattered metastatic lesions are  present in the aerated portion of the lung, a representative nodule of the right middle lobe measuring 2.8 by 2.6 cm, formerly 0.7 by 0.7 cm. Other nodules are likewise enlarged. Musculoskeletal: Thoracic spondylosis. As noted above, tumor along the right hemidiaphragm could be invading across the diaphragm. CT ABDOMEN PELVIS FINDINGS Hepatobiliary: There variety of hepatic cysts as well as new metastatic foci in the liver. A 2.0 by 1.6 cm metastatic lesion posteriorly in the right hepatic lobe is present on image 68/2. Moreover, the aid primary right kidney upper pole mass appears to be invading the liver on image 65/2. No portal vein thrombosis or visible hepatic vein thrombosis. No current biliary obstruction is identified although there is extensive tumor in the porta hepatis encasing the extrahepatic biliary tree. Pancreas: Porta hepatis tumor is tangential to the pancreatic body and head but not obviously invading the pancreas. No dorsal  pancreatic duct dilatation. Spleen: There potential deposits of tumor in the ascites surrounding the spleen but no metastatic lesion inside the parenchyma of the spleen is seen. Adrenals/Urinary Tract: The right kidney upper pole mass measures 8.3 by 6.2 cm (formerly 5.4 by 5.4 cm) and is invading the liver. There are numerous new deposits of tumor along the right perirenal space and along the right adrenal gland. The right adrenal tumor is somewhat indistinct but the main conglomerate adrenal mass measures 3.2 by 3.8 cm on image 63/2 and is new. Stomach/Bowel: The stomach body has a somewhat narrowed lumen. There are innumerable deposits of tumor in the mesentery around the stomach in some involvement of the gastric wall is not excluded given this unusual appearance. Mildly dilated loops of proximal small bowel are observed for example in the jejunum on image [id number], cause uncertain. Vascular/Lymphatic: Massive gastrohepatic ligament, porta hepatis, and retroperitoneal  adenopathy, markedly worsened from prior and confluent today. Encasement of the celiac artery branches and portal vein bite confluent adenopathy, without definite tumor thrombus or thrombosis. The IVC is narrowed by surrounding tumor and could possibly be invaded. Conglomerate left para-aortic adenopathy at the level of left renal vein measures 4.0 by 4.2 cm, formerly 1.4 by 2.4 cm. Infrarenal abdominal aortic aneurysm, fusiform, 3.8 cm in diameter. Reproductive: Mildly enlarged prostate gland. Other: Scattered ascites with innumerable deposits of tumor within the ascites, omentum, and mesentery. Diffuse mesenteric edema and mild subcutaneous edema. Musculoskeletal: Although I do not see direct bony involvement of tumor, there is some tumor extending into the thoracoabdominal wall in the right posterior eleventh intercostal space, image 69/2. This is along the region of the diaphragmatic tumor. IMPRESSION: 1. Markedly severe progression of malignancy burden, with extensive increased in adenopathy in the lower neck, chest, and abdomen; a large malignant right pleural effusion with tumor invading the diaphragm and extending into the right perirenal space; increase in size and number of pulmonary metastatic lesions; enlargement of the primary right kidney upper pole lesion with hepatic metastatic lesions and direct invasion of the liver by the tumor ; and extensive peritoneal, mesenteric, and omental studding of tumor. 2. There is a large pericardial effusion which could possibly cause tamponade. Cannot exclude malignant effusion. 3. Moderate left pleural effusion. 4. Small aneurysms of the ascending thoracic aorta and infrarenal abdominal aorta normally would merit follow up but are minor in comparison to the neoplastic findings. 5. Coronary, aortic arch, and branch vessel atherosclerotic vascular disease. Electronically Signed   By: Van Clines M.D.   On: 08/10/2016 09:13   Ct Abdomen Pelvis W  Contrast  Result Date: 08/10/2016 CLINICAL DATA:  Metastatic right renal cell carcinoma, restaging EXAM: CT CHEST, ABDOMEN, AND PELVIS WITH CONTRAST TECHNIQUE: Multidetector CT imaging of the chest, abdomen and pelvis was performed following the standard protocol during bolus administration of intravenous contrast. CONTRAST:  100 cc Isovue 300 COMPARISON:  Multiple exams, including 05/08/2016 and 04/24/2016 FINDINGS: CT CHEST FINDINGS Cardiovascular: Coronary, aortic arch, and branch vessel atherosclerotic vascular disease. Ascending aortic aneurysm, 4.1 cm diameter. Large pericardial effusion, previously small. Calcified aortic and mitral valves. Mediastinum/Nodes: Left supraclavicular node 2.7 cm in short axis, formerly 1.4 cm. Newly pathologic prevascular, left internal mammary, lower thoracic periaortic, and likely left axillary adenopathy, representative upper mediastinal node 1.7 cm in short axis on image 12/2 (formerly 0.6 cm) and representative left internal mammary node 1.0 cm in short axis on image 23/2 (formerly 0.3 cm). An upper paraesophageal lymph node is 0.8 cm in short  axis on image 17/2, formerly not seen. Just above the hiatus a lymph node adjacent to the thoracic aorta measures 2.8 cm in short axis, previously 0.9 cm. Lungs/Pleura: Large right and moderate left pleural effusions, with extensive tumor along the right hemidiaphragm posteriorly extending into the pleural space, and questionably invading through the diaphragm and partially into the perirenal space on the right. Scattered metastatic lesions are present in the aerated portion of the lung, a representative nodule of the right middle lobe measuring 2.8 by 2.6 cm, formerly 0.7 by 0.7 cm. Other nodules are likewise enlarged. Musculoskeletal: Thoracic spondylosis. As noted above, tumor along the right hemidiaphragm could be invading across the diaphragm. CT ABDOMEN PELVIS FINDINGS Hepatobiliary: There variety of hepatic cysts as well as  new metastatic foci in the liver. A 2.0 by 1.6 cm metastatic lesion posteriorly in the right hepatic lobe is present on image 68/2. Moreover, the aid primary right kidney upper pole mass appears to be invading the liver on image 65/2. No portal vein thrombosis or visible hepatic vein thrombosis. No current biliary obstruction is identified although there is extensive tumor in the porta hepatis encasing the extrahepatic biliary tree. Pancreas: Porta hepatis tumor is tangential to the pancreatic body and head but not obviously invading the pancreas. No dorsal pancreatic duct dilatation. Spleen: There potential deposits of tumor in the ascites surrounding the spleen but no metastatic lesion inside the parenchyma of the spleen is seen. Adrenals/Urinary Tract: The right kidney upper pole mass measures 8.3 by 6.2 cm (formerly 5.4 by 5.4 cm) and is invading the liver. There are numerous new deposits of tumor along the right perirenal space and along the right adrenal gland. The right adrenal tumor is somewhat indistinct but the main conglomerate adrenal mass measures 3.2 by 3.8 cm on image 63/2 and is new. Stomach/Bowel: The stomach body has a somewhat narrowed lumen. There are innumerable deposits of tumor in the mesentery around the stomach in some involvement of the gastric wall is not excluded given this unusual appearance. Mildly dilated loops of proximal small bowel are observed for example in the jejunum on image [id number], cause uncertain. Vascular/Lymphatic: Massive gastrohepatic ligament, porta hepatis, and retroperitoneal adenopathy, markedly worsened from prior and confluent today. Encasement of the celiac artery branches and portal vein bite confluent adenopathy, without definite tumor thrombus or thrombosis. The IVC is narrowed by surrounding tumor and could possibly be invaded. Conglomerate left para-aortic adenopathy at the level of left renal vein measures 4.0 by 4.2 cm, formerly 1.4 by 2.4 cm. Infrarenal  abdominal aortic aneurysm, fusiform, 3.8 cm in diameter. Reproductive: Mildly enlarged prostate gland. Other: Scattered ascites with innumerable deposits of tumor within the ascites, omentum, and mesentery. Diffuse mesenteric edema and mild subcutaneous edema. Musculoskeletal: Although I do not see direct bony involvement of tumor, there is some tumor extending into the thoracoabdominal wall in the right posterior eleventh intercostal space, image 69/2. This is along the region of the diaphragmatic tumor. IMPRESSION: 1. Markedly severe progression of malignancy burden, with extensive increased in adenopathy in the lower neck, chest, and abdomen; a large malignant right pleural effusion with tumor invading the diaphragm and extending into the right perirenal space; increase in size and number of pulmonary metastatic lesions; enlargement of the primary right kidney upper pole lesion with hepatic metastatic lesions and direct invasion of the liver by the tumor ; and extensive peritoneal, mesenteric, and omental studding of tumor. 2. There is a large pericardial effusion which could possibly cause tamponade. Cannot  exclude malignant effusion. 3. Moderate left pleural effusion. 4. Small aneurysms of the ascending thoracic aorta and infrarenal abdominal aorta normally would merit follow up but are minor in comparison to the neoplastic findings. 5. Coronary, aortic arch, and branch vessel atherosclerotic vascular disease. Electronically Signed   By: Van Clines M.D.   On: 08/10/2016 09:13    Cardiac Studies   TTE - Left ventricle: The cavity size was normal. Wall thickness was   increased in a pattern of mild LVH. Systolic function was normal.   The estimated ejection fraction was in the range of 60% to 65%.   Doppler parameters are consistent with abnormal left ventricular   relaxation (grade 1 diastolic dysfunction). The E/e&' ratio is   between 8-15, suggesting indeterminate LV filling pressure. -  Ventricular septum: Septal motion showed abnormal function and   dyssynergy. - Aortic valve: Trileaflet. Moderately calcified leaflets with what   appears to be an immobile right coronary cusp. There is mild   aortic stenosis without regurgitation. Mean gradient (S): 14 mm   Hg. Peak gradient (S): 22 mm Hg. Valve area (Vmax): 1.59 cm^2.   Valve area (Vmean): 1.52 cm^2. - Mitral valve: Calcified annulus. Mildly thickened leaflets . - Left atrium: The atrium was normal in size. - Right ventricle: The cavity size was normal. There is mild   diastolic RV collapse. - Right atrium: The atrium was normal in size. Mild diastolic   collapse. - Tricuspid valve: There was mild regurgitation. No respiratory   inflow variation. - Pulmonary arteries: PA peak pressure: 31 mm Hg (S). - Pericardium, extracardiac: Moderate sized pericardial effusion.   Features were not consistent with tamponade physiology. There was   a right pleural effusion. There was a left pleural effusion.   Assessment & Plan    Shortness of breath: Likely multifactorial due to metastatic renal cancer. Has a plural and pericardial effusion that is liekly malignant but no evidence of tamponade on his TTE.  His BP was low yesterday but has improved today.  Has diuresed with lasix. Plan for thoracentesis today.  Pericarditis: continue colchicine.  Hypertension: continue to hold antihypertensive meds  Signed, Will Meredith Leeds, MD  08/12/2016, 8:03 AM

## 2016-08-12 NOTE — Procedures (Signed)
Ultrasound-guided diagnostic and therapeutic right thoracentesis performed yielding 1.6 liters of yellow colored fluid. No immediate complications. Follow-up chest x-ray pending.      Bill Foley E 8:53 AM 08/12/2016

## 2016-08-13 ENCOUNTER — Other Ambulatory Visit: Payer: Self-pay | Admitting: Physician Assistant

## 2016-08-13 DIAGNOSIS — R0600 Dyspnea, unspecified: Secondary | ICD-10-CM | POA: Diagnosis not present

## 2016-08-13 DIAGNOSIS — J90 Pleural effusion, not elsewhere classified: Secondary | ICD-10-CM | POA: Diagnosis not present

## 2016-08-13 DIAGNOSIS — D509 Iron deficiency anemia, unspecified: Secondary | ICD-10-CM

## 2016-08-13 DIAGNOSIS — R0602 Shortness of breath: Secondary | ICD-10-CM

## 2016-08-13 DIAGNOSIS — I313 Pericardial effusion (noninflammatory): Secondary | ICD-10-CM

## 2016-08-13 DIAGNOSIS — I3139 Other pericardial effusion (noninflammatory): Secondary | ICD-10-CM

## 2016-08-13 DIAGNOSIS — I318 Other specified diseases of pericardium: Secondary | ICD-10-CM | POA: Diagnosis not present

## 2016-08-13 DIAGNOSIS — C801 Malignant (primary) neoplasm, unspecified: Secondary | ICD-10-CM | POA: Diagnosis not present

## 2016-08-13 LAB — BASIC METABOLIC PANEL
Anion gap: 10 (ref 5–15)
BUN: 28 mg/dL — AB (ref 6–20)
CALCIUM: 6.2 mg/dL — AB (ref 8.9–10.3)
CO2: 25 mmol/L (ref 22–32)
CREATININE: 0.96 mg/dL (ref 0.61–1.24)
Chloride: 101 mmol/L (ref 101–111)
GFR calc Af Amer: >60 mL/min (ref >60)
GLUCOSE: 118 mg/dL — AB (ref 65–99)
Potassium: 3.4 mmol/L — ABNORMAL LOW (ref 3.5–5.1)
SODIUM: 136 mmol/L (ref 135–145)

## 2016-08-13 LAB — CBC
HCT: 29.9 % — ABNORMAL LOW (ref 39.0–52.0)
Hemoglobin: 9.8 g/dL — ABNORMAL LOW (ref 13.0–17.0)
MCH: 28.7 pg (ref 26.0–34.0)
MCHC: 32.8 g/dL (ref 30.0–36.0)
MCV: 87.4 fL (ref 78.0–100.0)
PLATELETS: 249 10*3/uL (ref 150–400)
RBC: 3.42 MIL/uL — ABNORMAL LOW (ref 4.22–5.81)
RDW: 26.8 % — AB (ref 11.5–15.5)
WBC: 10.9 10*3/uL — ABNORMAL HIGH (ref 4.0–10.5)

## 2016-08-13 LAB — PREALBUMIN

## 2016-08-13 MED ORDER — POTASSIUM CHLORIDE CRYS ER 20 MEQ PO TBCR
40.0000 meq | EXTENDED_RELEASE_TABLET | Freq: Once | ORAL | Status: AC
  Administered 2016-08-13: 40 meq via ORAL
  Filled 2016-08-13: qty 2

## 2016-08-13 MED ORDER — COLCHICINE 0.6 MG PO TABS: 0.6000 mg | ORAL_TABLET | Freq: Two times a day (BID) | ORAL | 0 refills | Status: AC

## 2016-08-13 MED ORDER — TRAMADOL HCL 50 MG PO TABS: 50.0000 mg | ORAL_TABLET | Freq: Four times a day (QID) | ORAL | 0 refills | Status: AC | PRN

## 2016-08-13 MED ORDER — ENSURE ENLIVE PO LIQD: 237.0000 mL | Freq: Two times a day (BID) | ORAL | 12 refills | Status: AC

## 2016-08-13 MED ORDER — FERUMOXYTOL INJECTION 510 MG/17 ML
510.0000 mg | Freq: Once | INTRAVENOUS | Status: AC
  Administered 2016-08-13: 510 mg via INTRAVENOUS
  Filled 2016-08-13 (×2): qty 17

## 2016-08-13 MED ORDER — ACETAMINOPHEN 325 MG PO TABS: 650.0000 mg | ORAL_TABLET | Freq: Four times a day (QID) | ORAL | 0 refills | Status: AC | PRN

## 2016-08-13 MED ORDER — CALCIUM CARBONATE 1250 (500 CA) MG PO TABS: 1.0000 | ORAL_TABLET | Freq: Two times a day (BID) | ORAL | 0 refills | Status: AC

## 2016-08-13 MED ORDER — MAGNESIUM OXIDE 400 (241.3 MG) MG PO TABS: 400.0000 mg | ORAL_TABLET | Freq: Two times a day (BID) | ORAL | 0 refills | Status: AC

## 2016-08-13 NOTE — Addendum Note (Signed)
Addended by: Burney Gauze R on: 08/13/2016 05:15 PM   Modules accepted: Orders

## 2016-08-13 NOTE — Progress Notes (Addendum)
Patient Name: Bill Foley Date of Encounter: 08/13/2016  Primary Cardiologist: [facility] Problem List     Principal Problem:   Malignant pericardial effusion [facility]) Active Problems:   Dyspnea   Aortic aneurysm (HCC)   HTN (hypertension)   Hyperlipidemia   Primary cancer of right kidney with metastasis from kidney to other site [facility])   RBBB with left anterior fascicular block   Asymptomatic PVCs   Aortic valve sclerosis   Protein-calorie malnutrition, severe     Subjective   Denies dyspnea or chest pain  Inpatient Medications    Scheduled Meds: . calcium carbonate  1 tablet Oral BID WC  . colchicine  0.6 mg Oral BID  . feeding supplement (ENSURE ENLIVE)  237 mL Oral BID BM  . guaiFENesin  600 mg Oral BID  . magnesium oxide  400 mg Oral BID  . pravastatin  40 mg Oral QPM  . sodium chloride flush  3 mL Intravenous Q12H  . sodium chloride flush  3 mL Intravenous Q12H   Continuous Infusions:  PRN Meds: sodium chloride, acetaminophen **OR** acetaminophen, albuterol, HYDROcodone-acetaminophen, ondansetron **OR** ondansetron (ZOFRAN) IV, polyethylene glycol, sodium chloride flush   Vital Signs    Vitals:   08/12/16 0849 08/12/16 1300 08/12/16 2153 08/13/16 0456  BP: 107/68 127/68 119/68 130/69  Pulse:  79 80 75  Resp:   18 16  Temp:  98.4 F (36.9 C) 97.7 F (36.5 C) 98.2 F (36.8 C)  TempSrc:  Oral Oral Oral  SpO2:  95% 95% 95%  Weight:      Height:        Intake/Output Summary (Last 24 hours) at 08/13/16 1304 Last data filed at 08/13/16 1230  Gross per 24 hour  Intake              480 ml  Output                0 ml  Net              480 ml   Filed Weights   08/10/16 2353  Weight: 164 lb 1.6 oz (74.4 kg)    Physical Exam    GEN: Well nourished, well developed, in no acute distress.  HEENT: Grossly normal.  Neck: Supple Cardiac: RRR Respiratory:  Diminished BS bases GI: Soft, nontender, nondistended MS: no deformity or  atrophy. Skin: warm and dry, no rash. Neuro:  Strength and sensation are intact. Psych: AAOx3.  Normal affect.  Labs    CBC  Recent Labs  08/12/16 0326 08/13/16 0400  WBC 10.3 10.9*  HGB 9.7* 9.8*  HCT 30.6* 29.9*  MCV 86.9 87.4  PLT 244 0000000   Basic Metabolic Panel  Recent Labs  08/11/16 0147 08/12/16 0326 08/13/16 0400  NA 137 137 136  K 3.6 3.7 3.4*  CL 101 100* 101  CO2 25 26 25   GLUCOSE 96 132* 118*  BUN 44* 37* 28*  CREATININE 1.47* 1.21 0.96  CALCIUM 5.3* 5.6* 6.2*  MG 1.4*  --   --   PHOS 3.0  --   --    Liver Function Tests  Recent Labs  08/11/16 0147 08/12/16 1138  AST 17  --   ALT 15*  --   ALKPHOS 110  --   BILITOT 0.6  --   PROT 4.7* 4.6*  ALBUMIN 1.8*  --    Thyroid Function Tests  Recent Labs  08/11/16 0147 08/11/16 1154  TSH 9.109*  --  T3FREE  --  1.3*    Telemetry    Sinus rhythm - Personally Reviewed   Radiology    Dg Chest 1 View  Result Date: 08/12/2016 CLINICAL DATA:  82 year old male status post ultrasound-guided right side thoracentesis this morning. Initial encounter. EXAM: CHEST 1 VIEW COMPARISON:  Chest radiographs 08/10/2016. FINDINGS: PA view at 0845 hours. Regressed right pleural effusion and improved right lung base ventilation. No pneumothorax. Stable left lung. Stable cardiac size and mediastinal contours. Calcified aortic atherosclerosis. Visualized tracheal air column is within normal limits. No acute osseous abnormality identified. Increased oral contrast in left upper quadrant bowel. IMPRESSION: No pneumothorax following right side thoracentesis. Regressed right pleural effusion and improved right lung base ventilation. Electronically Signed   By: Genevie Ann M.D.   On: 08/12/2016 09:08   US Thoracentesis Asp Pleural Space W/img Guide  Result Date: 08/12/2016 INDICATION: History of renal cell carcinoma with metastatic disease. Patient likely has malignant pericardial effusion and is noted to also have a right  -sided pleural effusion. Request is made for diagnostic and therapeutic thoracentesis. EXAM: ULTRASOUND GUIDED DIAGNOSTIC AND THERAPEUTIC THORACENTESIS MEDICATIONS: 1% lidocaine. COMPLICATIONS: None immediate. PROCEDURE: An ultrasound guided thoracentesis was thoroughly discussed with the patient and questions answered. The benefits, risks, alternatives and complications were also discussed. The patient understands and wishes to proceed with the procedure. Written consent was obtained. Ultrasound was performed to localize and mark an adequate pocket of fluid in the right chest. The area was then prepped and draped in the normal sterile fashion. 1% Lidocaine was used for local anesthesia. Under ultrasound guidance a Safe-T-Centesis catheter was introduced. Thoracentesis was performed. The catheter was removed and a dressing applied. FINDINGS: A total of approximately 1.6 L of yellow fluid was removed. Samples were sent to the laboratory as requested by the clinical team. IMPRESSION: Successful ultrasound guided right thoracentesis yielding 1.6 L of pleural fluid. Read by: Saverio Danker, PA-C Electronically Signed   By: Jacqulynn Cadet M.D.   On: 08/12/2016 09:24    Cardiac Studies   TTE - Left ventricle: The cavity size was normal. Wall thickness was   increased in a pattern of mild LVH. Systolic function was normal.   The estimated ejection fraction was in the range of 60% to 65%.   Doppler parameters are consistent with abnormal left ventricular   relaxation (grade 1 diastolic dysfunction). The E/e&' ratio is   between 8-15, suggesting indeterminate LV filling pressure. - Ventricular septum: Septal motion showed abnormal function and   dyssynergy. - Aortic valve: Trileaflet. Moderately calcified leaflets with what   appears to be an immobile right coronary cusp. There is mild   aortic stenosis without regurgitation. Mean gradient (S): 14 mm   Hg. Peak gradient (S): 22 mm Hg. Valve area (Vmax):  1.59 cm^2.   Valve area (Vmean): 1.52 cm^2. - Mitral valve: Calcified annulus. Mildly thickened leaflets . - Left atrium: The atrium was normal in size. - Right ventricle: The cavity size was normal. There is mild   diastolic RV collapse. - Right atrium: The atrium was normal in size. Mild diastolic   collapse. - Tricuspid valve: There was mild regurgitation. No respiratory   inflow variation. - Pulmonary arteries: PA peak pressure: 31 mm Hg (S). - Pericardium, extracardiac: Moderate sized pericardial effusion.   Features were not consistent with tamponade physiology. There was   a right pleural effusion. There was a left pleural effusion.   Assessment & Plan    Shortness of breath: Likely  multifactorial due to metastatic renal cancer, pulmonary mets, pleural and pericardial effusion. Improved following thoracentesis.  Pericarditis/pericardial effusion: moderate on echo with no signs of tamponade; continue colchicine. Will need fu echo in one week; we will arrange; if evidence of tamponade, would favor pericardial window over pericardiocentesis as effusion is likely malignant.  Hypertension: continue to hold antihypertensive meds; BP normal at present.   Renal cell cancer-management per oncology.  Ok to DC from cardiac standpoint; TOC appt with APP one week and fu echo in one week. Signed, Kirk Ruths, MD  08/13/2016, 1:04 PM

## 2016-08-13 NOTE — Progress Notes (Signed)
CRITICAL VALUE ALERT  Critical value received:  Ca: 6.2  Date of notification:  08/13/16  Time of notification:  [id number]  Critical value read back: yes  Nurse who received alert: Myrtie Hawk RN  MD notified (1st page):  K. Kingwood  Time of first page:  [phone]  MD notified (2nd page):  Time of second page:  Responding MD: K. Sofia  Time MD responded:  [phone]  No new orders at this time. Will monitor.  Rich Number, RN, BSN

## 2016-08-13 NOTE — Consult Note (Signed)
Referral MD  Reason for Referral: Metastatic renal cell carcinoma-progressive on Votrient. Pericardial effusion and right pleural effusion   Chief Complaint  Patient presents with  . Shortness of Breath  : I have fluid around the heart.  HPI: Mr. Dasilva is well-known to me. He is an 82 year old white male. He has metastatic renal cell carcinoma. This is clear-cell carcinoma. We had him on Votrient. He developed a lot of diarrhea with Votrient. We have stopped the Votrient for about a week or so.  He had a recent scan to see how his disease has been responding. Unfortunately, scan showed a lot of fluid around the heart. I was worried about malignant pericardial tamponade on developing.  He was admitted. Cardiology, as always, did a fantastic job. They evaluated him. He had an echocardiogram done. His ejection fraction was 65 and 65%. There is no evidence of tamponade. He had moderate pericardial effusion. There was some concern about pericarditis. He was put on some colchicine.Marland Kitchen  He did have a thoracentesis. 1.6 L of fluid was removed. He feels better. He is breathing is feeling better. He can lie down without as much discomfort.  He really would like to go home now. He is feeling better. He had labwork done today. His hemoglobin is 9.8. Platelet count 249K. his sodium is 136 potassium 3.4. Calcium is 6.2. His albumin is only 1.8.  He's not complaining of any pain. He's not having any rashes. He's had no headache.  In the office, I had talked him about CODE STATUS. We talked about end-of-life issues. He did not want to be kept alive on machines. In the office, he was a DO NOT RESUSCITATE. I am not sure as to why he is a full code in the hospital.  Currently, his performance status is ECOG 2.    Past Medical History:  Diagnosis Date  . Aortic aneurysm ([place])   . Aortic aneurysm (HCC)    "JUST UNDER 2 CM"  . Arthritis    RT WRIST  . Basal cell carcinoma   . Gallstones   . GERD  (gastroesophageal reflux disease)    OCCASIONAL  . Hyperlipidemia   . Hypertension   . Iron deficiency anemia due to chronic blood loss 05/25/2016  . Primary cancer of right kidney with metastasis from kidney to other site [facility]) 05/25/2016  . Shortness of breath    WITH EXERTION  . Slow heart rate    "has been down to 45 in the past"  . Tinnitus   . Weak urinary stream   :  Past Surgical History:  Procedure Laterality Date  . ANKLE SURGERY Left 1975  . CHOLECYSTECTOMY N/A 04/08/2013   Procedure: LAPAROSCOPIC CHOLECYSTECTOMY WITH INTRAOPERATIVE CHOLANGIOGRAM;  Surgeon: Imogene Burn. Georgette Dover, MD;  Location: [facility];  Service: General;  Laterality: N/A;  . COLONOSCOPY    . INGUINAL HERNIA REPAIR Bilateral 08/06/2013   Procedure: HERNIA REPAIR INGUINAL ADULT BILATERAL;  Surgeon: Imogene Burn. Georgette Dover, MD;  Location: [place];  Service: General;  Laterality: Bilateral;  . LAPAROSCOPIC APPENDECTOMY N/A 04/08/2013   Procedure: LAPAROSCOPIC PARTIAL CECECTOMY;  Surgeon: Imogene Burn. Georgette Dover, MD;  Location: [facility];  Service: General;  Laterality: N/A;  . TONSILLECTOMY    . TOTAL KNEE ARTHROPLASTY  2007   RT  . VASECTOMY    :   Current Facility-Administered Medications:  .  0.9 %  sodium chloride infusion, 250 mL, Intravenous, PRN, Toy Baker, MD .  acetaminophen (TYLENOL) tablet 650 mg, 650  mg, Oral, Q6H PRN **OR** acetaminophen (TYLENOL) suppository 650 mg, 650 mg, Rectal, Q6H PRN, Toy Baker, MD .  albuterol (PROVENTIL) (2.5 MG/3ML) 0.083% nebulizer solution 2.5 mg, 2.5 mg, Nebulization, Q4H PRN, Toy Baker, MD .  calcium carbonate (OS-CAL - dosed in mg of elemental calcium) tablet 500 mg of elemental calcium, 1 tablet, Oral, BID WC, Lily Kocher, MD, 500 mg of elemental calcium at 08/12/16 1728 .  colchicine tablet 0.6 mg, 0.6 mg, Oral, BID, Will Meredith Leeds, MD, 0.6 mg at 08/12/16 2219 .  feeding supplement (ENSURE ENLIVE) (ENSURE ENLIVE) liquid 237 mL, 237 mL,  Oral, BID BM, Belkys A Regalado, MD .  ferrous sulfate tablet 325 mg, 325 mg, Oral, BID WC, Belkys A Regalado, MD, 325 mg at 08/12/16 1727 .  guaiFENesin (MUCINEX) 12 hr tablet 600 mg, 600 mg, Oral, BID, Toy Baker, MD, 600 mg at 08/12/16 2219 .  HYDROcodone-acetaminophen (NORCO/VICODIN) 5-325 MG per tablet 1 tablet, 1 tablet, Oral, Q4H PRN, Toy Baker, MD .  magnesium oxide (MAG-OX) tablet 400 mg, 400 mg, Oral, BID, Belkys A Regalado, MD, 400 mg at 08/12/16 2220 .  ondansetron (ZOFRAN) tablet 4 mg, 4 mg, Oral, Q6H PRN **OR** ondansetron (ZOFRAN) injection 4 mg, 4 mg, Intravenous, Q6H PRN, Toy Baker, MD .  polyethylene glycol (MIRALAX / GLYCOLAX) packet 17 g, 17 g, Oral, Daily PRN, Toy Baker, MD .  pravastatin (PRAVACHOL) tablet 40 mg, 40 mg, Oral, QPM, Toy Baker, MD, 40 mg at 08/12/16 1727 .  sodium chloride flush (NS) 0.9 % injection 3 mL, 3 mL, Intravenous, Q12H, Toy Baker, MD, 3 mL at 08/11/16 2203 .  sodium chloride flush (NS) 0.9 % injection 3 mL, 3 mL, Intravenous, Q12H, Toy Baker, MD, 3 mL at 08/12/16 2220 .  sodium chloride flush (NS) 0.9 % injection 3 mL, 3 mL, Intravenous, PRN, Toy Baker, MD:  . calcium carbonate  1 tablet Oral BID WC  . colchicine  0.6 mg Oral BID  . feeding supplement (ENSURE ENLIVE)  237 mL Oral BID BM  . ferrous sulfate  325 mg Oral BID WC  . guaiFENesin  600 mg Oral BID  . magnesium oxide  400 mg Oral BID  . pravastatin  40 mg Oral QPM  . sodium chloride flush  3 mL Intravenous Q12H  . sodium chloride flush  3 mL Intravenous Q12H  :  No Known Allergies:  Family History  Problem Relation Age of Onset  . Cancer Mother   . Alcohol abuse Father   :  Social History   Social History  . Marital status: Married    Spouse name: N/A  . Number of children: N/A  . Years of education: N/A   Occupational History  . Not on file.   Social History Main Topics  . Smoking status: Former  Smoker    Quit date: 03/11/1963  . Smokeless tobacco: Never Used  . Alcohol use 0.0 oz/week    5 - 7 Glasses of wine per week  . Drug use: No  . Sexual activity: Not on file   Other Topics Concern  . Not on file   Social History Narrative  . No narrative on file  :  Pertinent items are noted in HPI.  Exam: Patient Vitals for the past 24 hrs:  BP Temp Temp src Pulse Resp SpO2  08/13/16 0456 130/69 98.2 F (36.8 C) Oral 75 16 95 %  08/12/16 2153 119/68 97.7 F (36.5 C) Oral 80 18 95 %  08/12/16  1300 127/68 98.4 F (36.9 C) Oral 79 - 95 %  08/12/16 0849 107/68 - - - - -  08/12/16 0829 122/62 - - - - -    As above    Recent Labs  08/12/16 0326 08/13/16 0400  WBC 10.3 10.9*  HGB 9.7* 9.8*  HCT 30.6* 29.9*  PLT 244 249    Recent Labs  08/12/16 0326 08/13/16 0400  NA 137 136  K 3.7 3.4*  CL 100* 101  CO2 26 25  GLUCOSE 132* 118*  BUN 37* 28*  CREATININE 1.21 0.96  CALCIUM 5.6* 6.2*    Blood smear review:  None  Pathology: None     Assessment and Plan:  Mr. Baity is an 82 year old white male with metastatic renal cell carcinoma. He progressed despite Votrient. This is somewhat unusual.  I think that the next option would be immunotherapy with Nivolumab. I think this would be reasonable. His performance status is adequate.  I'm checking a prealbumin on him. I think this will be quite helpful to determine how well he really is going to do. I would not suspect his prealbumin is going to be on the low side.  We will see what the cytology is on the pleural effusion. I would have to imagine that this would be malignant.  Hopefully, the pericardial effusion will not worsen.   He is a little anemic. His iron studies do show iron deficiency. I will give him a dose of IV iron. I do not want him on oral iron.  Hopefully, he will be able to get out of the hospital today. He feels like he can go home. I cannot imagine that there would be any other invasive  procedure needs to be done on him.  I very much appreciate all the outstanding care that he is gotten from everybody over and [facility] [place], MD  2 Corinthians 9:8-9

## 2016-08-13 NOTE — Care Management Note (Signed)
Case Management Note Marvetta Gibbons RN, BSN Unit 2W-Case Manager [phone]  Patient Details  Name: Bill Foley MRN: [id number] Date of Birth: July 05, 1931  Subjective/Objective:  Pt admitted with malignant pericardial effusion, hx of CA                  Action/Plan: PTA pt lived at home with spouse- plan to return home with spouse- no CM needs noted  Expected Discharge Date:  08/12/16               Expected Discharge Plan:  Home/Self Care  In-House Referral:     Discharge planning Services  CM Consult  Post Acute Care Choice:    Choice offered to:     DME Arranged:    DME Agency:     HH Arranged:    [place] Agency:     Status of Service:  Completed, signed off  If discussed at [employer] of Stay Meetings, dates discussed:    Additional Comments:  Dawayne Patricia, RN 08/13/2016, 2:00 PM

## 2016-08-13 NOTE — Discharge Summary (Signed)
Physician Discharge Summary  Bill Foley [id number] DOB: November 09, 1930 DOA: 08/10/2016  PCP: Bill Inches, MD  Admit date: 08/10/2016 Discharge date: 08/13/2016  Admitted From: Home  Disposition:  Home   Recommendations for Outpatient Follow-up:  1. Follow up with cardiology in 1 week for ECHO.  2. Please obtain BMP/CBC in one week 3. Please follow up on the following pending results: follow cytology results from pleural fluids.  4. Needs repeat TSH level     Discharge Condition: Stable.  CODE STATUS: Full code.  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 HTN, HLD, AOV sclerosis, and a small AAA. Patient was refer for admission by his oncologist Dr Bill Foley for further tx of pleural effusion and pericardial effusion. Patient went to see Dr Bill Foley for  Follow up. He had CT chest that showed moderate pericardial effusion, and pleural effusion.    Assessment & Plan: Pericardial Effusion;  Likely malignant.  ECHO with  Not evidence of  pericardial tamponade.  Case discussed with Dr Bill Foley, plan is to treat medically for now. colchicine to treat possible pericarditis. .  BP stable. Continue to hold BP medications.  Received one time dose  of lasix 11-25.  Plan for repeat ECHO in 1 week.  Ok to discharge per cardiology.  Will provide prescription for tramadol PRN for pain.   Metastasis renal cancer, follow by Dr Bill Foley.   Moderate Left side pleural effusion.  S/P thoracentesis yielding 1.6 L.  Follow culture no growth to date. , cytology from pleural fluid.    Iron deficiency anemia, secondary to chronic blood loss.  Received IV iron.   Hypocalcemia; corrected calcium 7.1;  Continue with  oral supplement.   Hypomagnesemia;  Continue with  oral magnesium supplement.   Elevated TSH at 9;  Free T 4 normal.  Needs repeat  labs in 4 weeks.   AKI; monitor , improved.  Replete k.    Discharge Diagnoses:  Principal Problem:   Malignant pericardial effusion (HCC) Active Problems:   Dyspnea   Aortic aneurysm (HCC)   HTN (hypertension)   Hyperlipidemia   Primary cancer of right kidney with metastasis from kidney to other site Bill Foley)   RBBB with left anterior fascicular block   Asymptomatic PVCs   Aortic valve sclerosis   Protein-calorie malnutrition, severe    Discharge Instructions  Discharge Instructions    Diet - low sodium heart healthy    Complete by:  As directed    Increase activity slowly    Complete by:  As directed        Medication List    STOP taking these medications   amLODipine 10 MG tablet Commonly known as:  NORVASC   HYDROcodone-acetaminophen 5-325 MG tablet Commonly known as:  NORCO   losartan 100 MG tablet Commonly known as:  COZAAR     TAKE these medications   acetaminophen 325 MG tablet Commonly known as:  TYLENOL Take 2 tablets (650 mg total) by mouth every 6 (six) hours as needed for mild pain (or Fever >/= 101).   calcium carbonate 1250 (500 Ca) MG tablet Commonly known as:  OS-CAL - dosed in mg of elemental calcium Take 1 tablet (500 mg of elemental calcium total) by mouth 2 (two) times daily with a meal.   colchicine 0.6 MG tablet Take 1 tablet (0.6 mg total) by mouth 2 (two) times daily.   feeding  supplement (ENSURE ENLIVE) Liqd Take 237 mLs by mouth 2 (two) times daily between meals.   ibuprofen 200 MG tablet Commonly known as:  ADVIL,MOTRIN Take 200 mg by mouth every 6 (six) hours as needed for pain.   Krill Oil 300 MG Caps Take 300 mg by mouth every Monday, Wednesday, and Friday.   magnesium oxide 400 (241.3 Mg) MG tablet Commonly known as:  MAG-OX Take 1 tablet (400 mg total) by mouth 2 (two) times daily.   ondansetron 4 MG tablet Commonly known as:  ZOFRAN Take 1 tablet (4 mg total) by mouth every 8 (eight) hours as needed for nausea  or vomiting.   pravastatin 40 MG tablet Commonly known as:  PRAVACHOL Take 40 mg by mouth every evening.   traMADol 50 MG tablet Commonly known as:  ULTRAM Take 1 tablet (50 mg total) by mouth every 6 (six) hours as needed.      Follow-up Information    Bill Ruths, MD Follow up in 1 week(s).   Specialty:  Cardiology Why:  office will call you with appointment, call office if you dont hear from them. you need appointment in 1 week for Ultrasound of your heart  Contact information: [address] [place] [address] [phone]        Bill Napoleon, MD Follow up in 1 week(s).   Specialty:  Oncology Contact information: [address], [address] [place] [address] [phone]          No Known Allergies  Consultations:  Cardiology  Dr Bill Foley.    Procedures/Studies: Dg Chest 1 View  Result Date: 08/12/2016 CLINICAL DATA:  82 year old male status post ultrasound-guided right side thoracentesis this morning. Initial encounter. EXAM: CHEST 1 VIEW COMPARISON:  Chest radiographs 08/10/2016. FINDINGS: PA view at 0845 hours. Regressed right pleural effusion and improved right lung base ventilation. No pneumothorax. Stable left lung. Stable cardiac size and mediastinal contours. Calcified aortic atherosclerosis. Visualized tracheal air column is within normal limits. No acute osseous abnormality identified. Increased oral contrast in left upper quadrant bowel. IMPRESSION: No pneumothorax following right side thoracentesis. Regressed right pleural effusion and improved right lung base ventilation. Electronically Signed   By: Bill Foley M.D.   On: 08/12/2016 09:08   Dg Chest 2 View  Result Date: 08/10/2016 CLINICAL DATA:  Fluid in the lungs EXAM: CHEST  2 VIEW COMPARISON:  CT scan of the chest same day FINDINGS: There is small right pleural effusion. Atelectasis or infiltrate is noted in right base. Tiny left pleural effusion. Bilateral pulmonary  nodules are noted consistent with metastatic disease. No pulmonary edema. IMPRESSION: Small right pleural effusion. Atelectasis or infiltrate right base. Bilateral pulmonary nodules. Tiny left pleural effusion. No pulmonary edema. Electronically Signed   By: Bill Foley M.D.   On: 08/10/2016 16:00   Ct Chest W Contrast  Result Date: 08/10/2016 CLINICAL DATA:  Metastatic right renal cell carcinoma, restaging EXAM: CT CHEST, ABDOMEN, AND PELVIS WITH CONTRAST TECHNIQUE: Multidetector CT imaging of the chest, abdomen and pelvis was performed following the standard protocol during bolus administration of intravenous contrast. CONTRAST:  100 cc Isovue 300 COMPARISON:  Multiple exams, including 05/08/2016 and 04/24/2016 FINDINGS: CT CHEST FINDINGS Cardiovascular: Coronary, aortic arch, and branch vessel atherosclerotic vascular disease. Ascending aortic aneurysm, 4.1 cm diameter. Large pericardial effusion, previously small. Calcified aortic and mitral valves. Mediastinum/Nodes: Left supraclavicular node 2.7 cm in short axis, formerly 1.4 cm. Newly pathologic prevascular, left internal mammary, lower thoracic periaortic, and likely left  axillary adenopathy, representative upper mediastinal node 1.7 cm in short axis on image 12/2 (formerly 0.6 cm) and representative left internal mammary node 1.0 cm in short axis on image 23/2 (formerly 0.3 cm). An upper paraesophageal lymph node is 0.8 cm in short axis on image 17/2, formerly not seen. Just above the hiatus a lymph node adjacent to the thoracic aorta measures 2.8 cm in short axis, previously 0.9 cm. Lungs/Pleura: Large right and moderate left pleural effusions, with extensive tumor along the right hemidiaphragm posteriorly extending into the pleural space, and questionably invading through the diaphragm and partially into the perirenal space on the right. Scattered metastatic lesions are present in the aerated portion of the lung, a representative nodule of the right  middle lobe measuring 2.8 by 2.6 cm, formerly 0.7 by 0.7 cm. Other nodules are likewise enlarged. Musculoskeletal: Thoracic spondylosis. As noted above, tumor along the right hemidiaphragm could be invading across the diaphragm. CT ABDOMEN PELVIS FINDINGS Hepatobiliary: There variety of hepatic cysts as well as new metastatic foci in the liver. A 2.0 by 1.6 cm metastatic lesion posteriorly in the right hepatic lobe is present on image 68/2. Moreover, the aid primary right kidney upper pole mass appears to be invading the liver on image 65/2. No portal vein thrombosis or visible hepatic vein thrombosis. No current biliary obstruction is identified although there is extensive tumor in the porta hepatis encasing the extrahepatic biliary tree. Pancreas: Porta hepatis tumor is tangential to the pancreatic body and head but not obviously invading the pancreas. No dorsal pancreatic duct dilatation. Spleen: There potential deposits of tumor in the ascites surrounding the spleen but no metastatic lesion inside the parenchyma of the spleen is seen. Adrenals/Urinary Tract: The right kidney upper pole mass measures 8.3 by 6.2 cm (formerly 5.4 by 5.4 cm) and is invading the liver. There are numerous new deposits of tumor along the right perirenal space and along the right adrenal gland. The right adrenal tumor is somewhat indistinct but the main conglomerate adrenal mass measures 3.2 by 3.8 cm on image 63/2 and is new. Stomach/Bowel: The stomach body has a somewhat narrowed lumen. There are innumerable deposits of tumor in the mesentery around the stomach in some involvement of the gastric wall is not excluded given this unusual appearance. Mildly dilated loops of proximal small bowel are observed for example in the jejunum on image 123456, cause uncertain. Vascular/Lymphatic: Massive gastrohepatic ligament, porta hepatis, and retroperitoneal adenopathy, markedly worsened from prior and confluent today. Encasement of the celiac  artery branches and portal vein bite confluent adenopathy, without definite tumor thrombus or thrombosis. The IVC is narrowed by surrounding tumor and could possibly be invaded. Conglomerate left para-aortic adenopathy at the level of left renal vein measures 4.0 by 4.2 cm, formerly 1.4 by 2.4 cm. Infrarenal abdominal aortic aneurysm, fusiform, 3.8 cm in diameter. Reproductive: Mildly enlarged prostate gland. Other: Scattered ascites with innumerable deposits of tumor within the ascites, omentum, and mesentery. Diffuse mesenteric edema and mild subcutaneous edema. Musculoskeletal: Although I do not see direct bony involvement of tumor, there is some tumor extending into the thoracoabdominal wall in the right posterior eleventh intercostal space, image 69/2. This is along the region of the diaphragmatic tumor. IMPRESSION: 1. Markedly severe progression of malignancy burden, with extensive increased in adenopathy in the lower neck, chest, and abdomen; a large malignant right pleural effusion with tumor invading the diaphragm and extending into the right perirenal space; increase in size and number of pulmonary metastatic lesions; enlargement  of the primary right kidney upper pole lesion with hepatic metastatic lesions and direct invasion of the liver by the tumor ; and extensive peritoneal, mesenteric, and omental studding of tumor. 2. There is a large pericardial effusion which could possibly cause tamponade. Cannot exclude malignant effusion. 3. Moderate left pleural effusion. 4. Small aneurysms of the ascending thoracic aorta and infrarenal abdominal aorta normally would merit follow up but are minor in comparison to the neoplastic findings. 5. Coronary, aortic arch, and branch vessel atherosclerotic vascular disease. Electronically Signed   By: Van Clines M.D.   On: 08/10/2016 09:13   Ct Abdomen Pelvis W Contrast  Result Date: 08/10/2016 CLINICAL DATA:  Metastatic right renal cell carcinoma, restaging  EXAM: CT CHEST, ABDOMEN, AND PELVIS WITH CONTRAST TECHNIQUE: Multidetector CT imaging of the chest, abdomen and pelvis was performed following the standard protocol during bolus administration of intravenous contrast. CONTRAST:  100 cc Isovue 300 COMPARISON:  Multiple exams, including 05/08/2016 and 04/24/2016 FINDINGS: CT CHEST FINDINGS Cardiovascular: Coronary, aortic arch, and branch vessel atherosclerotic vascular disease. Ascending aortic aneurysm, 4.1 cm diameter. Large pericardial effusion, previously small. Calcified aortic and mitral valves. Mediastinum/Nodes: Left supraclavicular node 2.7 cm in short axis, formerly 1.4 cm. Newly pathologic prevascular, left internal mammary, lower thoracic periaortic, and likely left axillary adenopathy, representative upper mediastinal node 1.7 cm in short axis on image 12/2 (formerly 0.6 cm) and representative left internal mammary node 1.0 cm in short axis on image 23/2 (formerly 0.3 cm). An upper paraesophageal lymph node is 0.8 cm in short axis on image 17/2, formerly not seen. Just above the hiatus a lymph node adjacent to the thoracic aorta measures 2.8 cm in short axis, previously 0.9 cm. Lungs/Pleura: Large right and moderate left pleural effusions, with extensive tumor along the right hemidiaphragm posteriorly extending into the pleural space, and questionably invading through the diaphragm and partially into the perirenal space on the right. Scattered metastatic lesions are present in the aerated portion of the lung, a representative nodule of the right middle lobe measuring 2.8 by 2.6 cm, formerly 0.7 by 0.7 cm. Other nodules are likewise enlarged. Musculoskeletal: Thoracic spondylosis. As noted above, tumor along the right hemidiaphragm could be invading across the diaphragm. CT ABDOMEN PELVIS FINDINGS Hepatobiliary: There variety of hepatic cysts as well as new metastatic foci in the liver. A 2.0 by 1.6 cm metastatic lesion posteriorly in the right hepatic  lobe is present on image 68/2. Moreover, the aid primary right kidney upper pole mass appears to be invading the liver on image 65/2. No portal vein thrombosis or visible hepatic vein thrombosis. No current biliary obstruction is identified although there is extensive tumor in the porta hepatis encasing the extrahepatic biliary tree. Pancreas: Porta hepatis tumor is tangential to the pancreatic body and head but not obviously invading the pancreas. No dorsal pancreatic duct dilatation. Spleen: There potential deposits of tumor in the ascites surrounding the spleen but no metastatic lesion inside the parenchyma of the spleen is seen. Adrenals/Urinary Tract: The right kidney upper pole mass measures 8.3 by 6.2 cm (formerly 5.4 by 5.4 cm) and is invading the liver. There are numerous new deposits of tumor along the right perirenal space and along the right adrenal gland. The right adrenal tumor is somewhat indistinct but the main conglomerate adrenal mass measures 3.2 by 3.8 cm on image 63/2 and is new. Stomach/Bowel: The stomach body has a somewhat narrowed lumen. There are innumerable deposits of tumor in the mesentery around the stomach in  some involvement of the gastric wall is not excluded given this unusual appearance. Mildly dilated loops of proximal small bowel are observed for example in the jejunum on image [id number], cause uncertain. Vascular/Lymphatic: Massive gastrohepatic ligament, porta hepatis, and retroperitoneal adenopathy, markedly worsened from prior and confluent today. Encasement of the celiac artery branches and portal vein bite confluent adenopathy, without definite tumor thrombus or thrombosis. The IVC is narrowed by surrounding tumor and could possibly be invaded. Conglomerate left para-aortic adenopathy at the level of left renal vein measures 4.0 by 4.2 cm, formerly 1.4 by 2.4 cm. Infrarenal abdominal aortic aneurysm, fusiform, 3.8 cm in diameter. Reproductive: Mildly enlarged prostate gland.  Other: Scattered ascites with innumerable deposits of tumor within the ascites, omentum, and mesentery. Diffuse mesenteric edema and mild subcutaneous edema. Musculoskeletal: Although I do not see direct bony involvement of tumor, there is some tumor extending into the thoracoabdominal wall in the right posterior eleventh intercostal space, image 69/2. This is along the region of the diaphragmatic tumor. IMPRESSION: 1. Markedly severe progression of malignancy burden, with extensive increased in adenopathy in the lower neck, chest, and abdomen; a large malignant right pleural effusion with tumor invading the diaphragm and extending into the right perirenal space; increase in size and number of pulmonary metastatic lesions; enlargement of the primary right kidney upper pole lesion with hepatic metastatic lesions and direct invasion of the liver by the tumor ; and extensive peritoneal, mesenteric, and omental studding of tumor. 2. There is a large pericardial effusion which could possibly cause tamponade. Cannot exclude malignant effusion. 3. Moderate left pleural effusion. 4. Small aneurysms of the ascending thoracic aorta and infrarenal abdominal aorta normally would merit follow up but are minor in comparison to the neoplastic findings. 5. Coronary, aortic arch, and branch vessel atherosclerotic vascular disease. Electronically Signed   By: Van Clines M.D.   On: 08/10/2016 09:13   US Thoracentesis Asp Pleural Space W/img Guide  Result Date: 08/12/2016 INDICATION: History of renal cell carcinoma with metastatic disease. Patient likely has malignant pericardial effusion and is noted to also have a right -sided pleural effusion. Request is made for diagnostic and therapeutic thoracentesis. EXAM: ULTRASOUND GUIDED DIAGNOSTIC AND THERAPEUTIC THORACENTESIS MEDICATIONS: 1% lidocaine. COMPLICATIONS: None immediate. PROCEDURE: An ultrasound guided thoracentesis was thoroughly discussed with the patient and  questions answered. The benefits, risks, alternatives and complications were also discussed. The patient understands and wishes to proceed with the procedure. Written consent was obtained. Ultrasound was performed to localize and mark an adequate pocket of fluid in the right chest. The area was then prepped and draped in the normal sterile fashion. 1% Lidocaine was used for local anesthesia. Under ultrasound guidance a Safe-T-Centesis catheter was introduced. Thoracentesis was performed. The catheter was removed and a dressing applied. FINDINGS: A total of approximately 1.6 L of yellow fluid was removed. Samples were sent to the laboratory as requested by the clinical team. IMPRESSION: Successful ultrasound guided right thoracentesis yielding 1.6 L of pleural fluid. Read by: Saverio Danker, PA-C Electronically Signed   By: Jacqulynn Cadet M.D.   On: 08/12/2016 09:24       Subjective: He is feeling well, denies dyspnea. Denies worsening chest pain.   Discharge Exam: Vitals:   08/12/16 2153 08/13/16 0456  BP: 119/68 130/69  Pulse: 80 75  Resp: 18 16  Temp: 97.7 F (36.5 C) 98.2 F (36.8 C)   Vitals:   08/12/16 0849 08/12/16 1300 08/12/16 2153 08/13/16 0456  BP: 107/68 127/68 119/68 130/69  Pulse:  79 80 75  Resp:   18 16  Temp:  98.4 F (36.9 C) 97.7 F (36.5 C) 98.2 F (36.8 C)  TempSrc:  Oral Oral Oral  SpO2:  95% 95% 95%  Weight:      Height:        General: Pt is alert, awake, not in acute distress Cardiovascular: RRR, S1/S2 +, no rubs, no gallops Respiratory: CTA bilaterally, no wheezing, no rhonchi Abdominal: Soft, NT, ND, bowel sounds + Extremities: no edema, no cyanosis    The results of significant diagnostics from this hospitalization (including imaging, microbiology, ancillary and laboratory) are listed below for reference.     Microbiology: Recent Results (from the past 240 hour(s))  Culture, body fluid-bottle     Status: None (Preliminary result)    Collection Time: 08/12/16  8:35 AM  Result Value Ref Range Status   Specimen Description FLUID RIGHT PLEURAL  Final   Special Requests NONE  Final   Culture NO GROWTH 1 DAY  Final   Report Status PENDING  Incomplete  Gram stain     Status: None   Collection Time: 08/12/16  8:35 AM  Result Value Ref Range Status   Specimen Description FLUID RIGHT PLEURAL  Final   Special Requests NONE  Final   Gram Stain   Final    RARE WBC PRESENT, PREDOMINANTLY MONONUCLEAR NO ORGANISMS SEEN    Report Status 08/12/2016 FINAL  Final     Labs: BNP (last 3 results) No results for input(s): BNP in the last 8760 hours. Basic Metabolic Panel:  Recent Labs Lab 08/10/16 1058 08/10/16 1515 08/11/16 0147 08/12/16 0326 08/13/16 0400  NA 139 137 137 137 136  K 4.0 3.9 3.6 3.7 3.4*  CL 100 102 101 100* 101  CO2 26 23 25 26 25   GLUCOSE 108 115* 96 132* 118*  BUN 43* 43* 44* 37* 28*  CREATININE 1.6* 1.54* 1.47* 1.21 0.96  CALCIUM 6.0* 5.6* 5.3* 5.6* 6.2*  MG  --   --  1.4*  --   --   PHOS  --   --  3.0  --   --    Liver Function Tests:  Recent Labs Lab 08/10/16 1058 08/11/16 0147 08/12/16 1138  AST 19 17  --   ALT 18 15*  --   ALKPHOS 108* 110  --   BILITOT 0.70 0.6  --   PROT 5.4* 4.7* 4.6*  ALBUMIN 2.3* 1.8*  --    No results for input(s): LIPASE, AMYLASE in the last 168 hours. No results for input(s): AMMONIA in the last 168 hours. CBC:  Recent Labs Lab 08/10/16 1058 08/10/16 1515 08/11/16 0147 08/12/16 0326 08/13/16 0400  WBC 10.9* 11.5* 11.4* 10.3 10.9*  NEUTROABS 8.3*  --   --   --   --   HGB 10.3* 10.3* 9.7* 9.7* 9.8*  HCT 31.3* 32.3* 29.8* 30.6* 29.9*  MCV 87 87.3 86.6 86.9 87.4  PLT 236 238 221 244 249   Cardiac Enzymes: No results for input(s): CKTOTAL, CKMB, CKMBINDEX, TROPONINI in the last 168 hours. BNP: Invalid input(s): POCBNP CBG: No results for input(s): GLUCAP in the last 168 hours. D-Dimer No results for input(s): DDIMER in the last 72 hours. Hgb  A1c No results for input(s): HGBA1C in the last 72 hours. Lipid Profile No results for input(s): CHOL, HDL, LDLCALC, TRIG, CHOLHDL, LDLDIRECT in the last 72 hours. Thyroid function studies  Recent Labs  08/11/16 0147 08/11/16 1154  TSH 9.109*  --  T3FREE  --  1.3*   Anemia work up No results for input(s): VITAMINB12, FOLATE, FERRITIN, TIBC, IRON, RETICCTPCT in the last 72 hours. Urinalysis No results found for: COLORURINE, APPEARANCEUR, [place], [place], [place], [place], [place], [place], [place], UROBILINOGEN, NITRITE, LEUKOCYTESUR Sepsis Labs Invalid input(s): PROCALCITONIN,  WBC,  LACTICIDVEN Microbiology Recent Results (from the past 240 hour(s))  Culture, body fluid-bottle     Status: None (Preliminary result)   Collection Time: 08/12/16  8:35 AM  Result Value Ref Range Status   Specimen Description FLUID RIGHT PLEURAL  Final   Special Requests NONE  Final   Culture NO GROWTH 1 DAY  Final   Report Status PENDING  Incomplete  Gram stain     Status: None   Collection Time: 08/12/16  8:35 AM  Result Value Ref Range Status   Specimen Description FLUID RIGHT PLEURAL  Final   Special Requests NONE  Final   Gram Stain   Final    RARE WBC PRESENT, PREDOMINANTLY MONONUCLEAR NO ORGANISMS SEEN    Report Status 08/12/2016 FINAL  Final     Time coordinating discharge: Over 30 minutes  SIGNED:   Elmarie Shiley, MD  [facility] 08/13/2016, 1:51 PM Pager [phone]  If 7PM-7AM, please contact night-coverage [email or web address] Password [email or web address]

## 2016-08-13 NOTE — Progress Notes (Signed)
Order received to discharge patient.  Patient expresses readiness to discharge.  Discharge instructions, follow up, medications and instructions for their use were discussed with patient and patient voiced understanding.  Telemetry monitor removed and CCMD notified.  PIV access removed without difficulty.

## 2016-08-13 NOTE — Progress Notes (Signed)
START ON PATHWAY REGIMEN - Renal Cell  RCOS38: Nivolumab 240 mg q14 Days   A cycle is every 14 days:     Nivolumab (Opdivo(R)) 240 mg flat dose in 100 mL NS IV over 60 minutes. Inline filter required (low protein binding) Dose Mod: None Additional Orders: Severe immune-mediated reactions can occur (e.g. pneumonitis, colitis, and hepatitis). See prescribing information for more details including monitoring and required immediate management with steroids. Monitor thyroid, renal, liver  function tests, glucose, and sodium at baseline and periodically during therapy.  **Always confirm dose/schedule in your pharmacy ordering system**    Patient Characteristics: Metastatic, Clear Cell, Second Line, Prior Anti-Angiogenic Treatment AJCC Stage Grouping: IV Current evidence of distant metastases? Yes AJCC T Stage: X AJCC N Stage: X AJCC M Stage: X Does patient have oligometastatic disease? No Would you be surprised if this patient died  in the next year? I would NOT be surprised if this patient died in the next year Histology: Clear Cell Line of therapy: Second Line  Intent of Therapy: Non-Curative / Palliative Intent, Discussed with Patient

## 2016-08-14 ENCOUNTER — Other Ambulatory Visit: Payer: Self-pay | Admitting: *Deleted

## 2016-08-14 MED ORDER — TEMAZEPAM 15 MG PO CAPS: 15.0000 mg | ORAL_CAPSULE | Freq: Every evening | ORAL | 1 refills | Status: AC | PRN

## 2016-08-14 NOTE — Telephone Encounter (Signed)
Patient continues to have issues with sleep. He cannot sleep more than 30-60 minutes at a time. He has tried the medications available to him without relief. He would like something to help him sleep. Spoke to Dr Marin Olp and he wants patient to try Restoril 15mg  at hs. Prescription will be called in and patient aware.

## 2016-08-16 ENCOUNTER — Other Ambulatory Visit: Payer: Medicare HMO

## 2016-08-17 ENCOUNTER — Other Ambulatory Visit (HOSPITAL_BASED_OUTPATIENT_CLINIC_OR_DEPARTMENT_OTHER): Payer: Medicare HMO

## 2016-08-17 ENCOUNTER — Telehealth: Payer: Self-pay | Admitting: *Deleted

## 2016-08-17 ENCOUNTER — Ambulatory Visit (HOSPITAL_COMMUNITY)
Admit: 2016-08-17 | Discharge: 2016-08-17 | Disposition: A | Discharge status: Home / Self Care | Payer: Medicare HMO | Attending: Physician Assistant | Admitting: Physician Assistant

## 2016-08-17 ENCOUNTER — Ambulatory Visit (HOSPITAL_BASED_OUTPATIENT_CLINIC_OR_DEPARTMENT_OTHER): Payer: Medicare HMO

## 2016-08-17 ENCOUNTER — Ambulatory Visit (HOSPITAL_BASED_OUTPATIENT_CLINIC_OR_DEPARTMENT_OTHER): Payer: Medicare HMO | Admitting: Hematology & Oncology

## 2016-08-17 ENCOUNTER — Ambulatory Visit: Payer: Medicare HMO

## 2016-08-17 ENCOUNTER — Other Ambulatory Visit: Payer: Medicare HMO

## 2016-08-17 VITALS — BP 120/62 | HR 82 | Temp 97.6°F | Resp 20 | Ht 72.0 in | Wt 167.0 lb

## 2016-08-17 VITALS — BP 120/61 | HR 78 | Resp 17

## 2016-08-17 DIAGNOSIS — D509 Iron deficiency anemia, unspecified: Secondary | ICD-10-CM | POA: Diagnosis not present

## 2016-08-17 DIAGNOSIS — I080 Rheumatic disorders of both mitral and aortic valves: Secondary | ICD-10-CM | POA: Diagnosis not present

## 2016-08-17 DIAGNOSIS — R64 Cachexia: Secondary | ICD-10-CM

## 2016-08-17 DIAGNOSIS — C641 Malignant neoplasm of right kidney, except renal pelvis: Secondary | ICD-10-CM

## 2016-08-17 DIAGNOSIS — I3139 Other pericardial effusion (noninflammatory): Secondary | ICD-10-CM

## 2016-08-17 DIAGNOSIS — Z87891 Personal history of nicotine dependence: Secondary | ICD-10-CM | POA: Insufficient documentation

## 2016-08-17 DIAGNOSIS — E785 Hyperlipidemia, unspecified: Secondary | ICD-10-CM | POA: Diagnosis not present

## 2016-08-17 DIAGNOSIS — C799 Secondary malignant neoplasm of unspecified site: Secondary | ICD-10-CM | POA: Diagnosis not present

## 2016-08-17 DIAGNOSIS — I313 Pericardial effusion (noninflammatory): Secondary | ICD-10-CM | POA: Diagnosis not present

## 2016-08-17 DIAGNOSIS — D500 Iron deficiency anemia secondary to blood loss (chronic): Secondary | ICD-10-CM

## 2016-08-17 DIAGNOSIS — I10 Essential (primary) hypertension: Secondary | ICD-10-CM | POA: Insufficient documentation

## 2016-08-17 LAB — ECHOCARDIOGRAM LIMITED
CHL CUP RV SYS PRESS: 29 mmHg
CHL CUP TV REG PEAK VELOCITY: 255 cm/s
FS: 51 % — AB (ref 28–44)
IV/PV OW: 1.25
LA diam end sys: 42 mm
LA vol A4C: 86.7 ml
LA vol index: 40.4 mL/m2
LA vol: 78.4 mL
LADIAMINDEX: 2.16 cm/m2
LASIZE: 42 mm
LV PW d: 9.73 mm — AB (ref 0.6–1.1)
TR max vel: 255 cm/s

## 2016-08-17 LAB — CULTURE, BODY FLUID-BOTTLE: CULTURE: NO GROWTH

## 2016-08-17 LAB — COMPREHENSIVE METABOLIC PANEL
ALT: 18 U/L (ref 0–55)
ANION GAP: 11 meq/L (ref 3–11)
AST: 21 U/L (ref 5–34)
Albumin: <2 g/dL — ABNORMAL LOW (ref 3.5–5.0)
Alkaline Phosphatase: 166 U/L — ABNORMAL HIGH (ref 40–150)
BUN: 34.6 mg/dL — AB (ref 7.0–26.0)
CHLORIDE: 101 meq/L (ref 98–109)
CO2: 26 meq/L (ref 22–29)
CREATININE: 1.1 mg/dL (ref 0.7–1.3)
Calcium: 7.2 mg/dL — ABNORMAL LOW (ref 8.4–10.4)
EGFR: 61 mL/min/{1.73_m2} — ABNORMAL LOW (ref >90)
Glucose: 110 mg/dl (ref 70–140)
POTASSIUM: 5 meq/L (ref 3.5–5.1)
Sodium: 137 mEq/L (ref 136–145)
Total Bilirubin: 0.74 mg/dL (ref 0.20–1.20)
Total Protein: 5 g/dL — ABNORMAL LOW (ref 6.4–8.3)

## 2016-08-17 LAB — LACTATE DEHYDROGENASE: LDH: 210 U/L (ref 125–245)

## 2016-08-17 LAB — IRON AND TIBC
%SAT: 21 % (ref 20–55)
Iron: 26 ug/dL — ABNORMAL LOW (ref 42–163)
TIBC: 124 ug/dL — ABNORMAL LOW (ref 202–409)
UIBC: 98 ug/dL — AB (ref 117–376)

## 2016-08-17 LAB — CBC WITH DIFFERENTIAL/PLATELET
BASO%: 0.1 % (ref 0.0–2.0)
BASOS ABS: 0 10*3/uL (ref 0.0–0.1)
EOS%: 0.6 % (ref 0.0–7.0)
Eosinophils Absolute: 0.1 10*3/uL (ref 0.0–0.5)
HEMATOCRIT: 34.8 % — AB (ref 38.4–49.9)
HEMOGLOBIN: 10.9 g/dL — AB (ref 13.0–17.1)
LYMPH#: 1.3 10*3/uL (ref 0.9–3.3)
LYMPH%: 8.8 % — ABNORMAL LOW (ref 14.0–49.0)
MCH: 28.7 pg (ref 27.2–33.4)
MCHC: 31.3 g/dL — ABNORMAL LOW (ref 32.0–36.0)
MCV: 91.6 fL (ref 79.3–98.0)
MONO#: 2.3 10*3/uL — ABNORMAL HIGH (ref 0.1–0.9)
MONO%: 15.6 % — ABNORMAL HIGH (ref 0.0–14.0)
NEUT%: 74.9 % (ref 39.0–75.0)
NEUTROS ABS: 10.9 10*3/uL — AB (ref 1.5–6.5)
Platelets: 346 10*3/uL (ref 140–400)
RBC: 3.8 10*6/uL — ABNORMAL LOW (ref 4.20–5.82)
RDW: 26.1 % — AB (ref 11.0–14.6)
WBC: 14.5 10*3/uL — ABNORMAL HIGH (ref 4.0–10.3)

## 2016-08-17 LAB — CULTURE, BODY FLUID W GRAM STAIN -BOTTLE

## 2016-08-17 LAB — FERRITIN: Ferritin: 2155 ng/ml — ABNORMAL HIGH (ref 22–316)

## 2016-08-17 MED ORDER — FAMOTIDINE IN NACL 20-0.9 MG/50ML-% IV SOLN
INTRAVENOUS | Status: AC
  Filled 2016-08-17: qty 50

## 2016-08-17 MED ORDER — SODIUM CHLORIDE 0.9 % IV SOLN
Freq: Once | INTRAVENOUS | Status: AC
  Administered 2016-08-17: 15:00:00 via INTRAVENOUS

## 2016-08-17 MED ORDER — DEXAMETHASONE SODIUM PHOSPHATE 100 MG/10ML IJ SOLN
20.0000 mg | Freq: Once | INTRAMUSCULAR | Status: AC
Start: 2016-08-17 — End: 2016-08-17
  Administered 2016-08-17: 20 mg via INTRAVENOUS
  Filled 2016-08-17: qty 2

## 2016-08-17 MED ORDER — MEGESTROL ACETATE 400 MG/10ML PO SUSP: 400.0000 mg | Freq: Every day | ORAL | 2 refills | Status: AC

## 2016-08-17 MED ORDER — SODIUM CHLORIDE 0.9 % IV SOLN: Freq: Once | INTRAVENOUS | Status: DC

## 2016-08-17 MED ORDER — SODIUM CHLORIDE 0.9 % IV SOLN
510.0000 mg | Freq: Once | INTRAVENOUS | Status: AC
  Administered 2016-08-17: 510 mg via INTRAVENOUS
  Filled 2016-08-17: qty 17

## 2016-08-17 MED ORDER — FAMOTIDINE IN NACL 20-0.9 MG/50ML-% IV SOLN
40.0000 mg | Freq: Two times a day (BID) | INTRAVENOUS | Status: DC
  Administered 2016-08-17: 40 mg via INTRAVENOUS

## 2016-08-17 NOTE — Patient Instructions (Signed)
Ferumoxytol injection What is this medicine? FERUMOXYTOL is an iron complex. Iron is used to make healthy red blood cells, which carry oxygen and nutrients throughout the body. This medicine is used to treat iron deficiency anemia in people with chronic kidney disease. COMMON BRAND NAME(S): Feraheme What should I tell my health care provider before I take this medicine? They need to know if you have any of these conditions: -anemia not caused by low iron levels -high levels of iron in the blood -magnetic resonance imaging (MRI) test scheduled -an unusual or allergic reaction to iron, other medicines, foods, dyes, or preservatives -pregnant or trying to get pregnant -breast-feeding How should I use this medicine? This medicine is for injection into a vein. It is given by a health care professional in a hospital or clinic setting. Talk to your pediatrician regarding the use of this medicine in children. Special care may be needed. What if I miss a dose? It is important not to miss your dose. Call your doctor or health care professional if you are unable to keep an appointment. What may interact with this medicine? This medicine may interact with the following medications: -other iron products What should I watch for while using this medicine? Visit your doctor or healthcare professional regularly. Tell your doctor or healthcare professional if your symptoms do not start to get better or if they get worse. You may need blood work done while you are taking this medicine. You may need to follow a special diet. Talk to your doctor. Foods that contain iron include: whole grains/cereals, dried fruits, beans, or peas, leafy green vegetables, and organ meats (liver, kidney). What side effects may I notice from receiving this medicine? Side effects that you should report to your doctor or health care professional as soon as possible: -allergic reactions like skin rash, itching or hives, swelling of the  face, lips, or tongue -breathing problems -changes in blood pressure -feeling faint or lightheaded, falls -fever or chills -flushing, sweating, or hot feelings -swelling of the ankles or feet Side effects that usually do not require medical attention (report to your doctor or health care professional if they continue or are bothersome): -diarrhea -headache -nausea, vomiting -stomach pain Where should I keep my medicine? This drug is given in a hospital or clinic and will not be stored at home.  2017 Elsevier/Gold Standard (2015-10-06 12:41:49) Dehydration, Adult Dehydration is when there is not enough fluid or water in your body. This happens when you lose more fluids than you take in. Dehydration can range from mild to very bad. It should be treated right away to keep it from getting very bad. Symptoms of mild dehydration may include:  Thirst.  Dry lips.  Slightly dry mouth.  Dry, warm skin.  Dizziness. Symptoms of moderate dehydration may include:  Very dry mouth.  Muscle cramps.  Dark pee (urine). Pee may be the color of tea.  Your body making less pee.  Your eyes making fewer tears.  Heartbeat that is uneven or faster than normal (palpitations).  Headache.  Light-headedness, especially when you stand up from sitting.  Fainting (syncope). Symptoms of very bad dehydration may include:  Changes in skin, such as:  Cold and clammy skin.  Blotchy (mottled) or pale skin.  Skin that does not quickly return to normal after being lightly pinched and let go (poor skin turgor).  Changes in body fluids, such as:  Feeling very thirsty.  Your eyes making fewer tears.  Not sweating when body  temperature is high, such as in hot weather.  Your body making very little pee.  Changes in vital signs, such as:  Weak pulse.  Pulse that is more than 100 beats a minute when you are sitting still.  Fast breathing.  Low blood pressure.  Other changes, such  as:  Sunken eyes.  Cold hands and feet.  Confusion.  Lack of energy (lethargy).  Trouble waking up from sleep.  Short-term weight loss.  Unconsciousness. Follow these instructions at home:  If told by your doctor, drink an ORS:  Make an ORS by using instructions on the package.  Start by drinking small amounts, about  cup (120 mL) every 5-10 minutes.  Slowly drink more until you have had the amount that your doctor said to have.  Drink enough clear fluid to keep your pee clear or pale yellow. If you were told to drink an ORS, finish the ORS first, then start slowly drinking clear fluids. Drink fluids such as:  Water. Do not drink only water by itself. Doing that can make the salt (sodium) level in your body get too low (hyponatremia).  Ice chips.  Fruit juice that you have added water to (diluted).  Low-calorie sports drinks.  Avoid:  Alcohol.  Drinks that have a lot of sugar. These include high-calorie sports drinks, fruit juice that does not have water added, and soda.  Caffeine.  Foods that are greasy or have a lot of fat or sugar.  Take over-the-counter and prescription medicines only as told by your doctor.  Do not take salt tablets. Doing that can make the salt level in your body get too high (hypernatremia).  Eat foods that have minerals (electrolytes). Examples include bananas, oranges, potatoes, tomatoes, and spinach.  Keep all follow-up visits as told by your doctor. This is important. Contact a doctor if:  You have belly (abdominal) pain that:  Gets worse.  Stays in one area (localizes).  You have a rash.  You have a stiff neck.  You get angry or annoyed more easily than normal (irritability).  You are more sleepy than normal.  You have a harder time waking up than normal.  You feel:  Weak.  Dizzy.  Very thirsty.  You have peed (urinated) only a small amount of very dark pee during 6-8 hours. Get help right away if:  You  have symptoms of very bad dehydration.  You cannot drink fluids without throwing up (vomiting).  Your symptoms get worse with treatment.  You have a fever.  You have a very bad headache.  You are throwing up or having watery poop (diarrhea) and it:  Gets worse.  Does not go away.  You have blood or something green (bile) in your throw-up.  You have blood in your poop (stool). This may cause poop to look black and tarry.  You have not peed in 6-8 hours.  You pass out (faint).  Your heart rate when you are sitting still is more than 100 beats a minute.  You have trouble breathing. This information is not intended to replace advice given to you by your health care provider. Make sure you discuss any questions you have with your health care provider. Document Released: 06/30/2009 Document Revised: 03/23/2016 Document Reviewed: 10/28/2015 Elsevier Interactive Patient Education  2017 [employer].

## 2016-08-17 NOTE — Progress Notes (Signed)
  Echocardiogram 2D Echocardiogram limited has been performed.  Jennette Dubin 08/17/2016, 10:35 AM

## 2016-08-17 NOTE — Progress Notes (Signed)
Hematology and Oncology Follow Up Visit  JERALDO VIVIER [id number] [birth year] 82 y.o. 08/17/2016   Principle Diagnosis:   Metastatic renal cell carcinoma-progressive  Current Therapy:    Hospice care     Interim History:  Mr. Berl is in for a follow-up after being in the hospital. He was hospitalized a week ago. He was hospitalized because of concern of a pericardial effusion and a pleural effusion.  He clearly is showing progression.  Thankfully, there is no pericardial tamponade. He did have 1.6L of fluid removed from the right lung. The cytology on this came back with some atypia but nothing that looked like obvious malignancy. However, I suspect that this probably is from his underlying malignancy.  He was discharged this past Monday. He really is not that much better. He is quite weak. He does not have much of an appetite.  He has constipation alternating with diarrhea.  He has had no fever. He has had no rash.  Unfortunately, I just don't think that his performance status is good enough that we really can try any further therapy on him. He comes in a wheelchair today.  He has some slight abdominal distention.  He's had some nausea but no diarrhea.  I will have said that overall, his performance status is ECOG 3.  He did have an echocardiogram done today. I have not seen the results of this. Medications:  Current Outpatient Prescriptions:  .  acetaminophen (TYLENOL) 325 MG tablet, Take 2 tablets (650 mg total) by mouth every 6 (six) hours as needed for mild pain (or Fever >/= 101)., Disp: 30 tablet, Rfl: 0 .  calcium carbonate (OS-CAL - DOSED IN MG OF ELEMENTAL CALCIUM) 1250 (500 Ca) MG tablet, Take 1 tablet (500 mg of elemental calcium total) by mouth 2 (two) times daily with a meal., Disp: 20 tablet, Rfl: 0 .  colchicine 0.6 MG tablet, Take 1 tablet (0.6 mg total) by mouth 2 (two) times daily., Disp: 60 tablet, Rfl: 0 .  feeding supplement, ENSURE ENLIVE, (ENSURE  ENLIVE) LIQD, Take 237 mLs by mouth 2 (two) times daily between meals., Disp: 237 mL, Rfl: 12 .  Krill Oil 300 MG CAPS, Take 300 mg by mouth every Monday, Wednesday, and Friday. , Disp: , Rfl:  .  magnesium oxide (MAG-OX) 400 (241.3 Mg) MG tablet, Take 1 tablet (400 mg total) by mouth 2 (two) times daily., Disp: 20 tablet, Rfl: 0 .  ondansetron (ZOFRAN) 4 MG tablet, Take 1 tablet (4 mg total) by mouth every 8 (eight) hours as needed for nausea or vomiting., Disp: 30 tablet, Rfl: 1 .  pravastatin (PRAVACHOL) 40 MG tablet, Take 40 mg by mouth every evening. , Disp: , Rfl:  .  temazepam (RESTORIL) 15 MG capsule, Take 1 capsule (15 mg total) by mouth at bedtime as needed for sleep., Disp: 30 capsule, Rfl: 1 .  traMADol (ULTRAM) 50 MG tablet, Take 1 tablet (50 mg total) by mouth every 6 (six) hours as needed., Disp: 30 tablet, Rfl: 0 .  ibuprofen (ADVIL,MOTRIN) 200 MG tablet, Take 200 mg by mouth every 6 (six) hours as needed for pain., Disp: , Rfl:  .  megestrol (MEGACE) 400 MG/10ML suspension, Take 10 mLs (400 mg total) by mouth daily., Disp: 480 mL, Rfl: 2  Current Facility-Administered Medications:  .  famotidine (PEPCID) IVPB 20 mg premix, 40 mg, Intravenous, Q12H, Volanda Napoleon, MD, 40 mg at 08/17/16 1525  Facility-Administered Medications Ordered in Other Visits:  .  0.9 %  sodium chloride infusion, , Intravenous, Once, Volanda Napoleon, MD  Allergies: No Known Allergies  Past Medical History, Surgical history, Social history, and Family History were reviewed and updated.  Review of Systems: As above  Physical Exam:  height is 6' (1.829 m) and weight is 167 lb (75.8 kg). His oral temperature is 97.6 F (36.4 C). His blood pressure is 120/62 and his pulse is 82. His respiration is 20.   Wt Readings from Last 3 Encounters:  08/17/16 167 lb (75.8 kg)  08/10/16 164 lb 1.6 oz (74.4 kg)  08/10/16 167 lb (75.8 kg)     Thin white male in no obvious distress. Head and neck exam shows no  ocular or oral lesions. His oral mucosa might be slightly dry. There is no obvious mucositis. He has no oral thrush. There is no adenopathy in the neck. Lungs show good breath sounds bilaterally. He has no rales or wheezes. Cardiac exam regular rate and rhythm with no murmurs, rubs or bruits. Abdomen is soft. There may be some slight distention. There is no obvious abdominal mass. There is no fluid wave. There is no palpable liver or spleen tip. Back exam shows no tenderness over the spine, ribs or hips. Extremities shows muscle atrophy in upper and lower extremities. Skin exam shows no rashes, ecchymoses or petechia. Neurological exam shows no focal neurological deficits.  Lab Results  Component Value Date   WBC 10.9 (H) 08/13/2016   HGB 9.8 (L) 08/13/2016   HCT 29.9 (L) 08/13/2016   MCV 87.4 08/13/2016   PLT 249 08/13/2016     Chemistry      Component Value Date/Time   NA 137 08/17/2016 1039   K 5.0 08/17/2016 1039   CL 101 08/13/2016 0400   CL 100 08/10/2016 1058   CO2 26 08/17/2016 1039   BUN 34.6 (H) 08/17/2016 1039   CREATININE 1.1 08/17/2016 1039      Component Value Date/Time   CALCIUM 7.2 (L) 08/17/2016 1039   ALKPHOS 166 (H) 08/17/2016 1039   AST 21 08/17/2016 1039   ALT 18 08/17/2016 1039   BILITOT 0.74 08/17/2016 1039         Impression and Plan: Mr. Schacher is an 82 year old white male. He has metastatic renal cell carcinoma. He progressed despite Votrient.  He is just in no shape to take any additional therapy. He has an albumin less than 2. He is somewhat cachectic. Abdomen of this is all related to his underlying malignancy.  I told him that I would really favor that he have hospice be involved now. Possibly, I think that, his wife might have been involved with hospice.  I just don't see how he is going to tolerate any other therapy. Even immunotherapy I think would have significant complications.  I talked to him about hospice. I talked to him about  end-of-life issues. I had spoke with him a week ago about his desire for life-support measures. At that time, he said he did not want to be kept alive on machines. I totally agree with that.  I told him that our goal really should be to try to improve his quality of life so he might be more independent so that he might be able to do more things and enjoyed more activities with his family.  His son was with [place].  I told him that given his current status, that I would be surprised if he made it more than 6-8 weeks.  I told him that  his weight really shows [place] what is going on. I thought that the incredibly low albumin was a very critical factor in his prognosis.  We will go ahead and give him some IV fluid today. I will give him a little bit of Decadron and Pepcid. His iron level is borderline so I will give him a dose of IV iron.  He agreed to hospice. I talked to my nurse about calling hospice so they might be able to go out this weekend to see him.  I am surprised as to how aggressive his cancer is. I knew that when I first saw him that he had fairly extensive disease but I really thought that we would be able to help his quality of life.  I probably will see him back in about 2 weeks. Maybe the Megace will help with his appetite.  I spent about 40 minutes with he and his son. I just feel bad for him. I know that he is trying his best.   Volanda Napoleon, MD 12/1/20174:37 PM

## 2016-08-17 NOTE — Telephone Encounter (Signed)
Hospice Referral made to Amy at [facility]

## 2016-08-18 LAB — PREALBUMIN: PREALBUMIN: 4 mg/dL — ABNORMAL LOW (ref 9–32)

## 2016-08-20 ENCOUNTER — Encounter: Payer: Self-pay | Admitting: *Deleted

## 2016-08-20 ENCOUNTER — Ambulatory Visit: Payer: Medicare HMO | Admitting: Physician Assistant

## 2016-08-20 NOTE — Progress Notes (Deleted)
    Cardiology Office Note   Date:  08/20/2016   ID:  Bill Foley, DOB 07-Dec-1930, MRN [id number]  PCP:  Phineas Inches, MD  Cardiologist:  Dr Juanetta Snow, PA-C   No chief complaint on file.   History of Present Illness: Bill Foley is a 82 y.o. male with a history of metastatic renal cell CA, small aortic aneurysm, GERD, HTN, HLD, anemia, AoV sclerosis.   Admit 11/24-11/27 for SOB, found to have pleural and peric effusion, s/p thoracentesis w/ 1.6 L off.   12/01, pt seen by Dr Marin Olp, no further rx possible, Hospice referral made, life expectancy 6-8 weeks.

## 2016-08-21 ENCOUNTER — Other Ambulatory Visit: Payer: Medicare HMO

## 2016-08-27 ENCOUNTER — Telehealth: Payer: Self-pay | Admitting: *Deleted

## 2016-08-27 NOTE — Telephone Encounter (Signed)
Received call from [place]. Patient passed away this morning, 09/08/2016 at 3:23am  Dr Marin Olp notified.

## 2016-08-31 ENCOUNTER — Ambulatory Visit: Payer: Medicare HMO | Admitting: Hematology & Oncology

## 2016-08-31 ENCOUNTER — Other Ambulatory Visit: Payer: Medicare HMO

## 2016-08-31 ENCOUNTER — Ambulatory Visit: Payer: Medicare HMO

## 2016-09-14 ENCOUNTER — Ambulatory Visit: Payer: Medicare HMO

## 2016-09-14 ENCOUNTER — Other Ambulatory Visit: Payer: Medicare HMO

## 2016-09-14 ENCOUNTER — Ambulatory Visit: Payer: Medicare HMO | Admitting: Hematology & Oncology

## 2016-09-17 DEATH — deceased

## 2016-09-28 ENCOUNTER — Ambulatory Visit: Payer: Medicare HMO

## 2016-09-28 ENCOUNTER — Other Ambulatory Visit: Payer: Medicare HMO

## 2016-10-12 ENCOUNTER — Other Ambulatory Visit: Payer: Medicare HMO

## 2016-10-12 ENCOUNTER — Ambulatory Visit: Payer: Medicare HMO | Admitting: Hematology & Oncology

## 2016-10-12 ENCOUNTER — Ambulatory Visit: Payer: Medicare HMO

## 2016-10-26 ENCOUNTER — Other Ambulatory Visit: Payer: Medicare HMO

## 2016-10-26 ENCOUNTER — Ambulatory Visit: Payer: Medicare HMO

## 2018-10-23 ENCOUNTER — Encounter: Payer: Self-pay | Admitting: Hematology & Oncology

## 2018-10-23 ENCOUNTER — Other Ambulatory Visit: Payer: Self-pay | Admitting: Nurse Practitioner
